# Patient Record
Sex: Female | Born: 1980 | Race: Black or African American | Hispanic: No | Marital: Single | Smoking: Never smoker
Health system: Southern US, Community
[De-identification: ages and names within clinical notes are randomized; demographics above are authoritative.]

## PROBLEM LIST (undated history)

## (undated) DIAGNOSIS — D219 Benign neoplasm of connective and other soft tissue, unspecified: Secondary | ICD-10-CM

## (undated) DIAGNOSIS — I10 Essential (primary) hypertension: Secondary | ICD-10-CM

## (undated) DIAGNOSIS — K219 Gastro-esophageal reflux disease without esophagitis: Secondary | ICD-10-CM

## (undated) DIAGNOSIS — D649 Anemia, unspecified: Secondary | ICD-10-CM

## (undated) DIAGNOSIS — N920 Excessive and frequent menstruation with regular cycle: Secondary | ICD-10-CM

---

## 2013-11-19 ENCOUNTER — Encounter (HOSPITAL_COMMUNITY): Payer: Self-pay | Admitting: Emergency Medicine

## 2013-11-19 ENCOUNTER — Emergency Department (HOSPITAL_COMMUNITY)
Admission: EM | Admit: 2013-11-19 | Discharge: 2013-11-19 | Disposition: A | Discharge status: Home / Self Care | Payer: 59 | Source: Home / Self Care

## 2013-11-19 DIAGNOSIS — M25469 Effusion, unspecified knee: Secondary | ICD-10-CM

## 2013-11-19 DIAGNOSIS — M2550 Pain in unspecified joint: Secondary | ICD-10-CM

## 2013-11-19 MED ORDER — METHYLPREDNISOLONE 4 MG PO KIT: PACK | ORAL | Status: DC

## 2013-11-19 NOTE — ED Provider Notes (Signed)
CSN: [id number]     Arrival date & time 11/19/13  0831 History   First MD Initiated Contact with Patient 11/19/13 0840     Chief Complaint  Patient presents with  . Joint Swelling   (Consider location/radiation/quality/duration/timing/severity/associated sxs/prior Treatment) HPI Comments: 33 year old female complaining of migrating arthralgias. Primarily occurring in the lower extremities but also in the right elbow. arthralgias are followed by minor swelling and puffiness particularly in the ankles and knees. Denies fever. She went to the [facility] emergency Department a little over a week ago and had fluid aspirated from the knee. She called for results recently and was told that they lost the samples. Today she is having minor right ankle discomfort, left ankle discomfort with mild surrounding puffiness and right elbow pain.   History reviewed. No pertinent past medical history. Past Surgical History  Procedure Laterality Date  . Cesarean section     History reviewed. No pertinent family history. History  Substance Use Topics  . Smoking status: Never Smoker   . Smokeless tobacco: Not on file  . Alcohol Use: No   OB History   Grav Para Term Preterm Abortions TAB SAB Ect Mult Living                 Review of Systems  Constitutional: Positive for activity change. Negative for fever and fatigue.  HENT: Negative.   Respiratory: Negative.   Cardiovascular: Negative.   Gastrointestinal: Negative.   Genitourinary: Negative.   Musculoskeletal: Positive for arthralgias. Negative for back pain, myalgias and neck pain.  Skin: Negative.   Neurological: Negative.     Allergies  Review of patient's allergies indicates not on file.  Home Medications   Prior to Admission medications   Medication Sig Start Date End Date Taking? Authorizing Provider  Ibuprofen (MOTRIN IB PO) Take by mouth.   Yes Historical Provider, MD  methylPREDNISolone (MEDROL DOSEPAK) 4 MG tablet As  directed. Take with food. 11/19/13   Janne Napoleon, NP   BP 147/104  Pulse 103  Temp(Src) 97.3 F (36.3 C) (Oral)  Resp 16  SpO2 94%  LMP 10/31/2013 Physical Exam  Nursing note and vitals reviewed. Constitutional: She is oriented to person, place, and time. She appears well-developed and well-nourished. No distress.  HENT:  Mouth/Throat: Oropharynx is clear and moist. No oropharyngeal exudate.  Eyes: Conjunctivae and EOM are normal. Pupils are equal, round, and reactive to light.  Neck: Normal range of motion. Neck supple.  Thyroid appears full, slightly enlarged. No nodules palpated no tenderness.  Cardiovascular: Normal rate, regular rhythm and normal heart sounds.   Pulmonary/Chest: Effort normal and breath sounds normal. No respiratory distress. She has no wheezes. She has no rales.  Musculoskeletal: Normal range of motion. She exhibits edema and tenderness.  No joint erythema. No knee tenderness bilaterally. Mild/moderate tenderness of bilateral ankles and right elbow. Ambulatory with full weightbearing. Full range of motion of all joints.  Lymphadenopathy:    She has no cervical adenopathy.  Neurological: She is alert and oriented to person, place, and time.  Skin: Skin is warm and dry.  Psychiatric: She has a normal mood and affect.    ED Course  Procedures (including critical care time) Labs Review Labs Reviewed - No data to display  Imaging Review No results found.   MDM   1. Polyarthralgia   2. Joint swelling of lower leg     Medrol dose pack See PCP for eval and labs to assess conditions that may include autoimmune,  inflammatory illness Also have thyroid panel, thyroid looks a little full.     Janne Napoleon, NP 11/19/13 [phone]

## 2013-11-19 NOTE — Discharge Instructions (Signed)
Arthralgia °Your caregiver has diagnosed you as suffering from an arthralgia. Arthralgia means there is pain in a joint. This can come from many reasons including: °· Bruising the joint which causes soreness (inflammation) in the joint. °· Wear and tear on the joints which occur as we grow older (osteoarthritis). °· Overusing the joint. °· Various forms of arthritis. °· Infections of the joint. °Regardless of the cause of pain in your joint, most of these different pains respond to anti-inflammatory drugs and rest. The exception to this is when a joint is infected, and these cases are treated with antibiotics, if it is a bacterial infection. °HOME CARE INSTRUCTIONS  °· Rest the injured area for as long as directed by your caregiver. Then slowly start using the joint as directed by your caregiver and as the pain allows. Crutches as directed may be useful if the ankles, knees or hips are involved. If the knee was splinted or casted, continue use and care as directed. If an stretchy or elastic wrapping bandage has been applied today, it should be removed and re-applied every 3 to 4 hours. It should not be applied tightly, but firmly enough to keep swelling down. Watch toes and feet for swelling, bluish discoloration, coldness, numbness or excessive pain. If any of these problems (symptoms) occur, remove the ace bandage and re-apply more loosely. If these symptoms persist, contact your caregiver or return to this location. °· For the first 24 hours, keep the injured extremity elevated on pillows while lying down. °· Apply ice for 15-20 minutes to the sore joint every couple hours while awake for the first half day. Then 03-04 times per day for the first 48 hours. Put the ice in a plastic bag and place a towel between the bag of ice and your skin. °· Wear any splinting, casting, elastic bandage applications, or slings as instructed. °· Only take over-the-counter or prescription medicines for pain, discomfort, or fever as  directed by your caregiver. Do not use aspirin immediately after the injury unless instructed by your physician. Aspirin can cause increased bleeding and bruising of the tissues. °· If you were given crutches, continue to use them as instructed and do not resume weight bearing on the sore joint until instructed. °Persistent pain and inability to use the sore joint as directed for more than 2 to 3 days are warning signs indicating that you should see a caregiver for a follow-up visit as soon as possible. Initially, a hairline fracture (break in bone) may not be evident on X-rays. Persistent pain and swelling indicate that further evaluation, non-weight bearing or use of the joint (use of crutches or slings as instructed), or further X-rays are indicated. X-rays may sometimes not show a small fracture until a week or 10 days later. Make a follow-up appointment with your own caregiver or one to whom we have referred you. A radiologist (specialist in reading X-rays) may read your X-rays. Make sure you know how you are to obtain your X-ray results. Do not assume everything is normal if you do not hear from us. °SEEK MEDICAL CARE IF: °Bruising, swelling, or pain increases. °SEEK IMMEDIATE MEDICAL CARE IF:  °· Your fingers or toes are numb or blue. °· The pain is not responding to medications and continues to stay the same or get worse. °· The pain in your joint becomes severe. °· You develop a fever over 102° F (38.9° C). °· It becomes impossible to move or use the joint. °MAKE SURE YOU:  °·   Understand these instructions. °· Will watch your condition. °· Will get help right away if you are not doing well or get worse. °Document Released: 04/05/2005 Document Revised: 06/28/2011 Document Reviewed: 11/22/2007 °ExitCare® Patient Information ©2015 [employer]. This information is not intended to replace advice given to you by your health care provider. Make sure you discuss any questions you have with your health care  provider. ° °Arthritis, Nonspecific °Arthritis is pain, redness, warmth, or puffiness (inflammation) of a joint. The joint may be stiff or hurt when you move it. One or more joints may be affected. There are many types of arthritis. Your doctor may not know what type you have right away. The most common cause of arthritis is wear and tear on the joint (osteoarthritis). °HOME CARE  °· Only take medicine as told by your doctor. °· Rest the joint as much as possible. °· Raise (elevate) your joint if it is puffy. °· Use crutches if the painful joint is in your leg. °· Drink enough fluids to keep your pee (urine) clear or pale yellow. °· Follow your doctor's diet instructions. °· Use cold packs for very bad joint pain for 10 to 15 minutes every hour. Ask your doctor if it is okay for you to use hot packs. °· Exercise as told by your doctor. °· Take a warm shower if you have stiffness in the morning. °· Move your sore joints throughout the day. °GET HELP RIGHT AWAY IF:  °· You have a fever. °· You have very bad joint pain, puffiness, or redness. °· You have many joints that are painful and puffy. °· You are not getting better with treatment. °· You have very bad back pain or leg weakness. °· You cannot control when you poop (bowel movement) or pee (urinate). °· You do not feel better in 24 hours or are getting worse. °· You are having side effects from your medicine. °MAKE SURE YOU:  °· Understand these instructions. °· Will watch your condition. °· Will get help right away if you are not doing well or get worse. °Document Released: 06/30/2009 Document Revised: 10/05/2011 Document Reviewed: 06/30/2009 °ExitCare® Patient Information ©2015 [employer]. This information is not intended to replace advice given to you by your health care provider. Make sure you discuss any questions you have with your health care provider. ° °

## 2013-11-19 NOTE — ED Notes (Signed)
Pt  Has    swelling of  Some  Of  The  Joints  In her  Body       Especially  The  l  Ankle     As   Well  The  l  Wrist           And  r  Elbow    Some  Pain in the  Joints  As  Well     Pt  Was  Seen in  Er  sev  Weeks  Ago  And  Was  rx     Prednisone         Which  Helped while  Taking  It  But  Ran  Out      -  She  Reports  Had  Blood  Drawn  But  Samples  Was  Lost  She  Reports

## 2013-11-21 NOTE — ED Provider Notes (Signed)
Medical screening examination/treatment/procedure(s) were performed by non-physician practitioner and as supervising physician I was immediately available for consultation/collaboration.  Philipp Deputy, M.D.  Harden Mo, MD 11/21/13 [phone]

## 2015-06-19 ENCOUNTER — Emergency Department (INDEPENDENT_AMBULATORY_CARE_PROVIDER_SITE_OTHER)
Admission: EM | Admit: 2015-06-19 | Discharge: 2015-06-19 | Disposition: A | Discharge status: Home / Self Care | Payer: 59 | Source: Home / Self Care | Attending: Family Medicine | Admitting: Family Medicine

## 2015-06-19 ENCOUNTER — Emergency Department (INDEPENDENT_AMBULATORY_CARE_PROVIDER_SITE_OTHER): Payer: 59

## 2015-06-19 ENCOUNTER — Encounter (HOSPITAL_COMMUNITY): Payer: Self-pay | Admitting: Emergency Medicine

## 2015-06-19 ENCOUNTER — Emergency Department (HOSPITAL_COMMUNITY): Payer: 59

## 2015-06-19 DIAGNOSIS — M25562 Pain in left knee: Secondary | ICD-10-CM

## 2015-06-19 DIAGNOSIS — M222X1 Patellofemoral disorders, right knee: Secondary | ICD-10-CM

## 2015-06-19 DIAGNOSIS — M25561 Pain in right knee: Secondary | ICD-10-CM

## 2015-06-19 DIAGNOSIS — M222X2 Patellofemoral disorders, left knee: Secondary | ICD-10-CM | POA: Diagnosis not present

## 2015-06-19 DIAGNOSIS — M25569 Pain in unspecified knee: Secondary | ICD-10-CM | POA: Diagnosis not present

## 2015-06-19 MED ORDER — NAPROXEN SODIUM 550 MG PO TABS: 550.0000 mg | ORAL_TABLET | Freq: Two times a day (BID) | ORAL | Status: DC

## 2015-06-19 NOTE — Discharge Instructions (Signed)
Heat Therapy Heat therapy can help ease sore, stiff, injured, and tight muscles and joints. Heat relaxes your muscles, which may help ease your pain. Heat therapy should only be used on old, pre-existing, or long-lasting (chronic) injuries. Do not use heat therapy unless told by your doctor. HOW TO USE HEAT THERAPY There are several different kinds of heat therapy, including:  Moist heat pack.  Warm water bath.  Hot water bottle.  Electric heating pad.  Heated gel pack.  Heated wrap.  Electric heating pad. GENERAL HEAT THERAPY RECOMMENDATIONS   Do not sleep while using heat therapy. Only use heat therapy while you are awake.  Your skin may turn pink while using heat therapy. Do not use heat therapy if your skin turns red.  Do not use heat therapy if you have new pain.  High heat or long exposure to heat can cause burns. Be careful when using heat therapy to avoid burning your skin.  Do not use heat therapy on areas of your skin that are already irritated, such as with a rash or sunburn. GET HELP IF:   You have blisters, redness, swelling (puffiness), or numbness.  You have new pain.  Your pain is worse. MAKE SURE YOU:  Understand these instructions.  Will watch your condition.  Will get help right away if you are not doing well or get worse.   This information is not intended to replace advice given to you by your health care provider. Make sure you discuss any questions you have with your health care provider.   Document Released: 06/28/2011 Document Revised: 04/26/2014 Document Reviewed: 05/29/2013 Elsevier Interactive Patient Education 2016 Elsevier Inc. Patellofemoral Pain Syndrome Patellofemoral pain syndrome is a condition that involves a softening or breakdown of the tissue (cartilage) on the underside of your kneecap (patella). This causes pain in the front of the knee. The condition is also called runner's knee or chondromalacia patella. Patellofemoral pain  syndrome is most common in young adults who are active in sports. Your knee is the largest joint in your body. The patella covers the front of your knee and is attached to muscles above and below your knee. The underside of the patella is covered with a smooth type of cartilage (synovium). The smooth surface helps the patella glide easily when you move your knee. Patellofemoral pain syndrome causes swelling in the joint linings and bone surfaces in your knee.  CAUSES  Patellofemoral pain syndrome can be caused by:  Overuse.  Poor alignment of your knee joints.  Weak leg muscles.  A direct blow to your kneecap. RISK FACTORS You may be at risk for patellofemoral pain syndrome if you:  Do a lot of activities that can wear down your kneecap. These include:  Running.  Squatting.  Climbing stairs.  Start a new physical activity or exercise program.  Wear shoes that do not fit well.  Do not have good leg strength.  Are overweight. SIGNS AND SYMPTOMS  Knee pain is the most common symptom of patellofemoral pain syndrome. This may feel like a dull, aching pain underneath your patella, in the front of your knee. There may be a popping or cracking sound when you move your knee. Pain may get worse with:  Exercise.  Climbing stairs.  Running.  Jumping.  Squatting.  Kneeling.  Sitting for a long time.  Moving or pushing on your patella. DIAGNOSIS  Your health care provider may be able to diagnose patellofemoral pain syndrome from your symptoms and medical history.  You may be asked about your recent physical activities and which ones cause knee pain. Your health care provider may do a physical exam with certain tests to confirm the diagnosis. These may include:  Moving your patella back and forth.  Checking your range of knee motion.  Having you squat or jump to see if you have pain.  Checking the strength of your leg muscles. An MRI of the knee may also be done. TREATMENT    Patellofemoral pain syndrome can usually be treated at home with rest, ice, compression, and elevation (RICE). Other treatments may include:  Nonsteroidal anti-inflammatory drugs (NSAIDs).  Physical therapy to stretch and strengthen your leg muscles.  Shoe inserts (orthotics) to take stress off your knee.  A knee brace or knee support.  Surgery to remove damaged cartilage or move the patella to a better position. The need for surgery is rare. HOME CARE INSTRUCTIONS   Take medicines only as directed by your health care provider.  Rest your knee.  When resting, keep your knee raised above the level of your heart.  Avoid activities that cause knee pain.  Apply ice to the injured area:  Put ice in a plastic bag.  Place a towel between your skin and the bag.  Leave the ice on for 20 minutes, 2-3 times a day.  Use splints, braces, knee supports, or walking aids as directed by your health care provider.  Perform stretching and strengthening exercises as directed by your health care provider or physical therapist.  Keep all follow-up visits as directed by your health care provider. This is important. SEEK MEDICAL CARE IF:   Your symptoms get worse.  You are not improving with home care. MAKE SURE YOU:  Understand these instructions.  Will watch your condition.  Will get help right away if you are not doing well or get worse.   This information is not intended to replace advice given to you by your health care provider. Make sure you discuss any questions you have with your health care provider.   Document Released: 03/24/2009 Document Revised: 04/26/2014 Document Reviewed: 06/25/2013 Elsevier Interactive Patient Education [employer].

## 2015-06-19 NOTE — ED Notes (Signed)
Knee pain, recently started walking/jogging.  Bilateral knee pain that has worsened since last week.

## 2015-06-19 NOTE — ED Provider Notes (Signed)
CSN: [id number]     Arrival date & time 06/19/15  1306 History   First MD Initiated Contact with Patient 06/19/15 1328     Chief Complaint  Patient presents with  . 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  QUALITY: similar to previous episode of knee pain about 1 year ago. Treated with prednisone and did get better without problem until the last week. MODIFYING FACTORS: RICE treatment, ibuprofen ASSOCIATED SYMPTOMS: sensation that there is decreased ROM TIMING:now contant OCCUPATION: nurse tech ED  No past medical history on file. Past Surgical History  Procedure Laterality Date  . Cesarean section     No family history on file. Social History  Substance Use Topics  . Smoking status: Never Smoker   . Smokeless tobacco: Not on file  . Alcohol Use: No   OB History    No data available     Review of Systems B/l knee pain left worse than right Allergies  Review of patient's allergies indicates not on file.  Home Medications   Prior to Admission medications   Medication Sig Start Date End Date Taking? Authorizing Provider  Ibuprofen (MOTRIN IB PO) Take by mouth.    Historical Provider, MD  methylPREDNISolone (MEDROL DOSEPAK) 4 MG tablet As directed. Take with food. 11/19/13   Janne Napoleon, NP   Meds Ordered and Administered this Visit  Medications - No data to display  BP 147/97 mmHg  Pulse 74  Temp(Src) 98.2 F (36.8 C) (Oral)  Resp 18  SpO2 100% No data found.   Physical Exam  Constitutional: She appears well-developed and well-nourished.  Musculoskeletal: Normal range of motion. She exhibits no tenderness.       Right knee: She exhibits normal range of motion, no swelling,  no deformity, no laceration and normal alignment.       Legs: Nursing note and vitals reviewed.   ED Course  Procedures (including critical care time)  Labs Review Labs Reviewed - No data to display  Imaging Review Dg Knee Complete 4 Views Left  06/19/2015  CLINICAL DATA:  Pain after jogging.  No focal trauma history. EXAM: LEFT KNEE - COMPLETE 4+ VIEW COMPARISON:  None. FINDINGS: Frontal, lateral, and bilateral oblique views were obtained. There is no fracture or dislocation. No joint effusion. The joint spaces appear normal. No erosive change. IMPRESSION: No fracture or effusion.  No appreciable arthropathic change. Electronically Signed   By: Lowella Grip III M.D.   On: 06/19/2015 14:24   Dg Knee Complete 4 Views Right  06/19/2015  CLINICAL DATA:  Pain after jogging ; no trauma history EXAM: RIGHT KNEE - COMPLETE 4+ VIEW COMPARISON:  None. FINDINGS: Frontal, lateral, and bilateral oblique views were obtained. There is no demonstrable acute fracture or dislocation. No joint effusion. The joint spaces appear normal. No erosive change. There is an apparent unfused apophysis along the lateral proximal most aspect of the fibula. 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 No demonstrable acute fracture or dislocation. No joint effusion. No appreciable arthropathic change. Electronically Signed   By: Lowella Grip III M.D.   On: 06/19/2015 14:31     Visual Acuity Review  Right Eye Distance:   Left Eye Distance:   Bilateral Distance:  Right Eye Near:   Left Eye Near:    Bilateral Near:        Review of knee XR with patient no major bony pathology for cause of discomfort.  MDM   1. Knee pain, bilateral   2. Patellofemoral arthralgia of both knees    Patient is reassured that there is no indication for more advance testing at this time.  Patient is advised to continue home symptomatic treatment.  Patient is advised that if there are new or  worsening symptoms or attend the emergency department, or contact primary care provider. Instructions of care provided discharged home in stable condition. Return to work/school note provided.  THIS NOTE WAS GENERATED USING A VOICE RECOGNITION SOFTWARE PROGRAM. ALL REASONABLE EFFORTS  WERE MADE TO PROOFREAD THIS DOCUMENT FOR ACCURACY.     Konrad Felix, [place] 06/19/15 [phone]

## 2015-07-01 DIAGNOSIS — M7052 Other bursitis of knee, left knee: Secondary | ICD-10-CM | POA: Diagnosis not present

## 2015-07-01 DIAGNOSIS — M7051 Other bursitis of knee, right knee: Secondary | ICD-10-CM | POA: Diagnosis not present

## 2015-07-16 DIAGNOSIS — M7051 Other bursitis of knee, right knee: Secondary | ICD-10-CM | POA: Diagnosis not present

## 2015-07-16 DIAGNOSIS — M7052 Other bursitis of knee, left knee: Secondary | ICD-10-CM | POA: Diagnosis not present

## 2015-07-16 DIAGNOSIS — M79605 Pain in left leg: Secondary | ICD-10-CM | POA: Diagnosis not present

## 2015-07-16 DIAGNOSIS — M79662 Pain in left lower leg: Secondary | ICD-10-CM | POA: Diagnosis not present

## 2015-07-16 DIAGNOSIS — R600 Localized edema: Secondary | ICD-10-CM | POA: Diagnosis not present

## 2015-12-16 DIAGNOSIS — D485 Neoplasm of uncertain behavior of skin: Secondary | ICD-10-CM | POA: Diagnosis not present

## 2017-03-16 DIAGNOSIS — Z1331 Encounter for screening for depression: Secondary | ICD-10-CM | POA: Diagnosis not present

## 2017-03-16 DIAGNOSIS — I10 Essential (primary) hypertension: Secondary | ICD-10-CM | POA: Diagnosis not present

## 2017-03-16 DIAGNOSIS — Z6828 Body mass index (BMI) 28.0-28.9, adult: Secondary | ICD-10-CM | POA: Diagnosis not present

## 2017-04-13 MED FILL — LISINOPRIL 10 MG TABS: 10 | 30 days supply | Qty: 30 | Fill #0

## 2017-04-14 DIAGNOSIS — I10 Essential (primary) hypertension: Secondary | ICD-10-CM | POA: Diagnosis not present

## 2017-04-14 DIAGNOSIS — R42 Dizziness and giddiness: Secondary | ICD-10-CM | POA: Diagnosis not present

## 2017-04-14 MED FILL — LISINOPRIL-HCTZ 10-12.5 MG: 10-12.5 | 30 days supply | Qty: 30 | Fill #0

## 2017-05-25 DIAGNOSIS — Z6828 Body mass index (BMI) 28.0-28.9, adult: Secondary | ICD-10-CM | POA: Diagnosis not present

## 2017-05-25 DIAGNOSIS — I10 Essential (primary) hypertension: Secondary | ICD-10-CM | POA: Diagnosis not present

## 2017-05-25 MED FILL — LISINOPRIL-HCTZ 10-12.5 MG: 10-12.5 | 30 days supply | Qty: 30 | Fill #0

## 2017-07-15 MED FILL — LISINOPRIL-HCTZ 10-12.5 MG: 10-12.5 | 30 days supply | Qty: 30 | Fill #1

## 2017-08-24 DIAGNOSIS — I10 Essential (primary) hypertension: Secondary | ICD-10-CM | POA: Diagnosis not present

## 2017-08-24 DIAGNOSIS — Z6827 Body mass index (BMI) 27.0-27.9, adult: Secondary | ICD-10-CM | POA: Diagnosis not present

## 2017-08-24 DIAGNOSIS — Z136 Encounter for screening for cardiovascular disorders: Secondary | ICD-10-CM | POA: Diagnosis not present

## 2017-08-24 DIAGNOSIS — Z131 Encounter for screening for diabetes mellitus: Secondary | ICD-10-CM | POA: Diagnosis not present

## 2017-08-24 MED FILL — LISINOPRIL-HCTZ 10-12.5 MG: 10-12.5 | 30 days supply | Qty: 30 | Fill #0

## 2017-10-25 MED FILL — LISINOPRIL-HCTZ 10-12.5 MG: 10-12.5 | 30 days supply | Qty: 30 | Fill #1

## 2017-12-16 IMAGING — DX DG KNEE COMPLETE 4+V*R*
4 series · 4 of 4 positions shown · non-contrast
Comparison: None.

CLINICAL DATA: Pain after jogging ; no trauma history

EXAM:
RIGHT KNEE - COMPLETE 4+ VIEW

[knee ap]
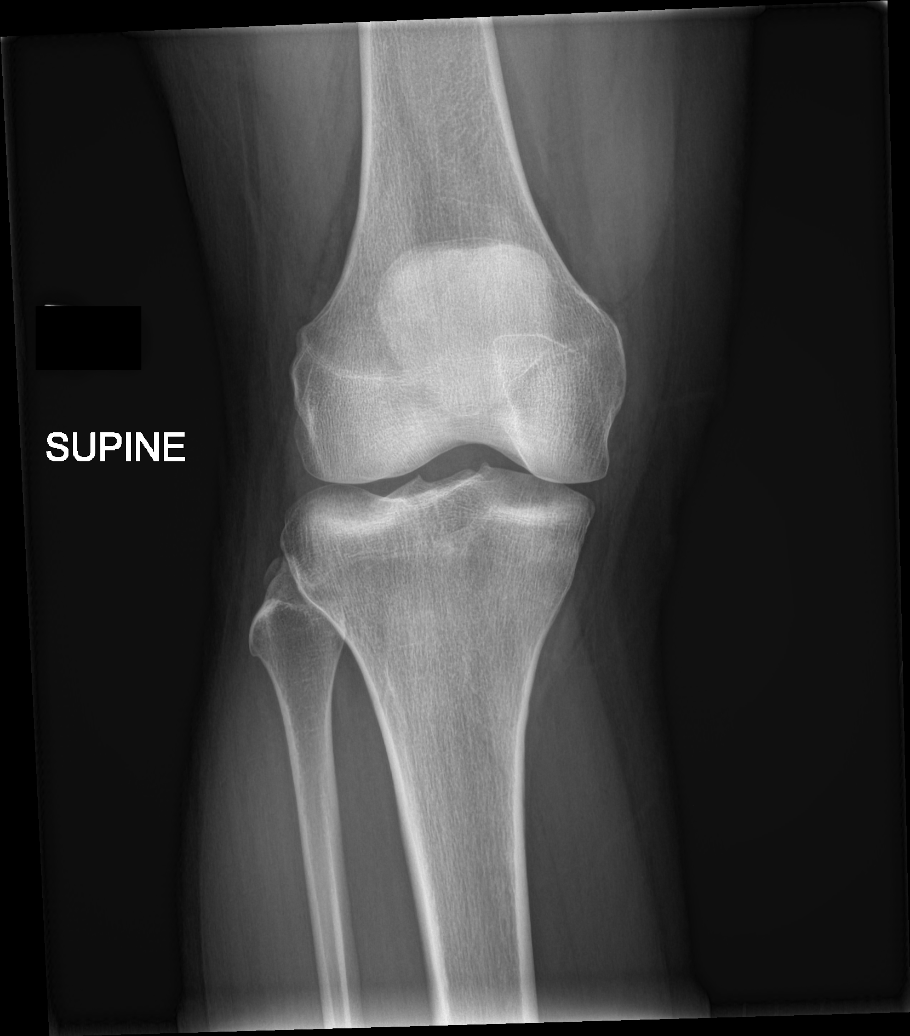

[knee obl (1 of 2)]
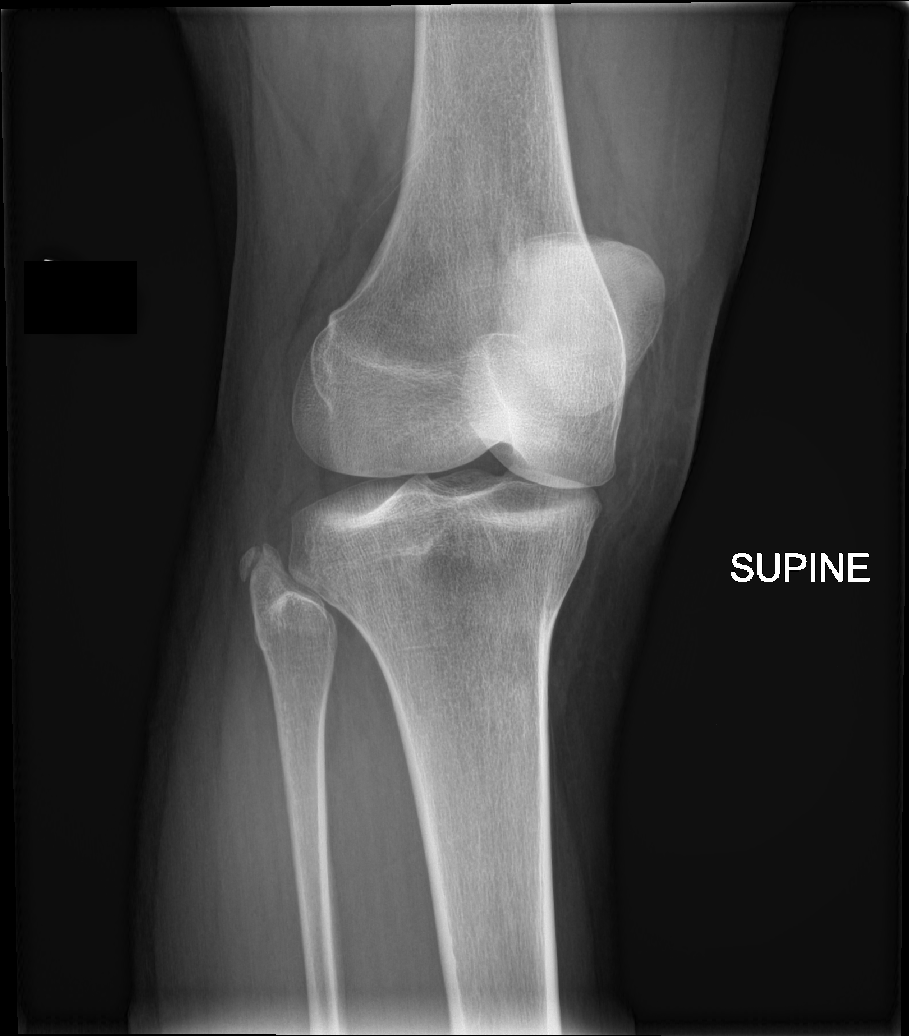

[knee obl (2 of 2)]
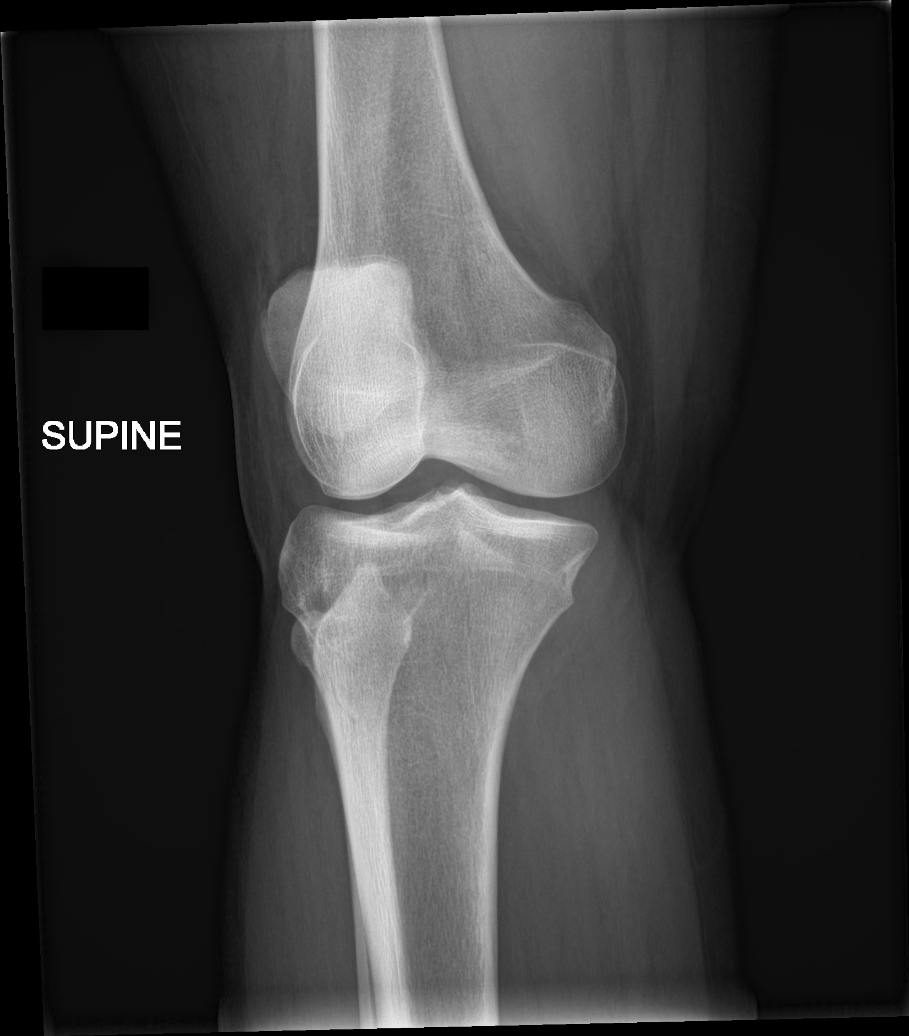

[knee lat]
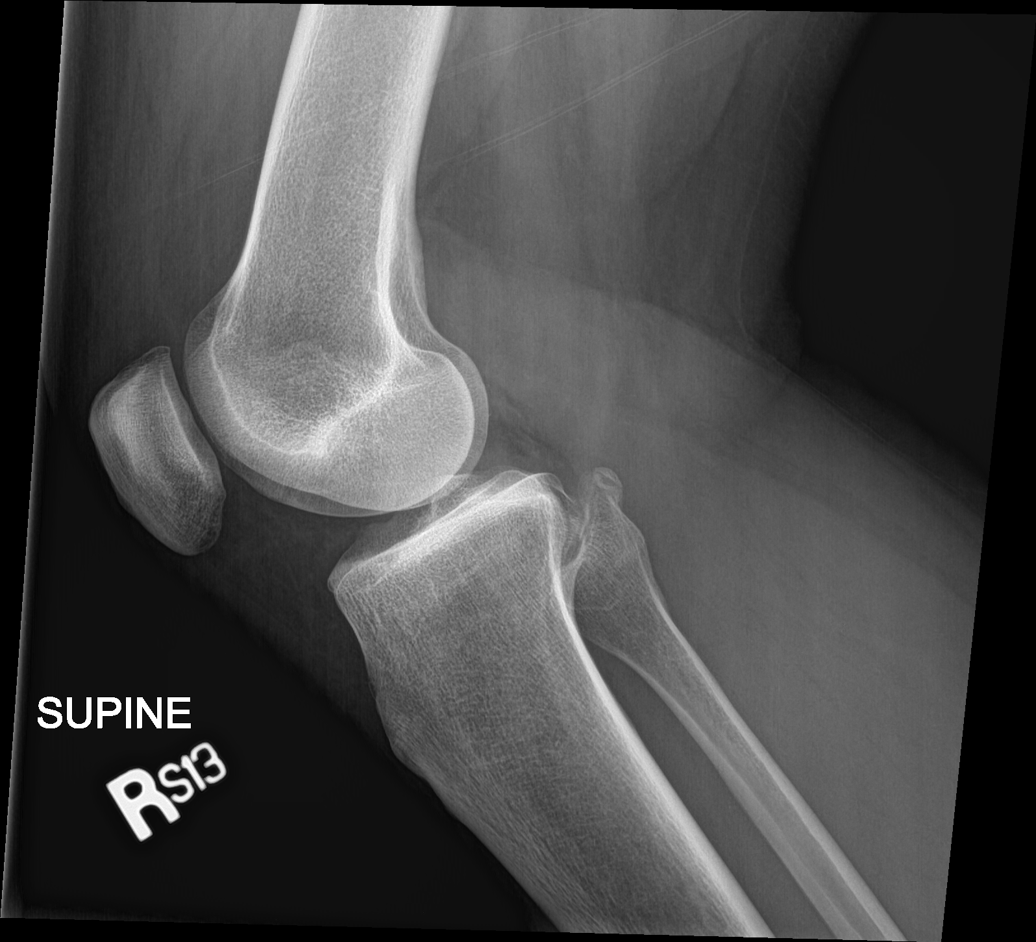

[4 of 4 positions shown; findings below may reference images not displayed]

FINDINGS: Frontal, lateral, and bilateral oblique views were obtained. There
is no demonstrable acute fracture or dislocation. No joint effusion.
The joint spaces appear normal. No erosive change. There is an
apparent unfused apophysis along the lateral proximal most aspect of
the fibula.
IMPRESSION: Probable unfused apophysis along the anterior lateral fibula. No
demonstrable acute fracture or dislocation. No joint effusion. No
appreciable arthropathic change.

## 2017-12-20 MED FILL — LISINOPRIL-HCTZ 10-12.5 MG: 10-12.5 | 30 days supply | Qty: 30 | Fill #2

## 2018-02-02 MED FILL — LISINOPRIL-HCTZ 10-12.5 MG: 10-12.5 | 30 days supply | Qty: 30 | Fill #3

## 2018-03-09 DIAGNOSIS — I10 Essential (primary) hypertension: Secondary | ICD-10-CM | POA: Diagnosis not present

## 2018-03-22 MED FILL — LISINOPRIL-HCTZ 10-12.5 MG: 10-12.5 | 30 days supply | Qty: 30 | Fill #4

## 2018-05-10 DIAGNOSIS — D485 Neoplasm of uncertain behavior of skin: Secondary | ICD-10-CM | POA: Diagnosis not present

## 2018-05-10 DIAGNOSIS — L578 Other skin changes due to chronic exposure to nonionizing radiation: Secondary | ICD-10-CM | POA: Diagnosis not present

## 2018-05-10 DIAGNOSIS — L821 Other seborrheic keratosis: Secondary | ICD-10-CM | POA: Diagnosis not present

## 2018-05-26 MED FILL — LISINOPRIL-HCTZ 10-12.5 MG: 10-12.5 | 30 days supply | Qty: 30 | Fill #5

## 2018-07-11 MED FILL — LISINOPRIL-HCTZ 10-12.5 MG: 10-12.5 | 90 days supply | Qty: 90 | Fill #0

## 2018-09-07 DIAGNOSIS — I10 Essential (primary) hypertension: Secondary | ICD-10-CM | POA: Diagnosis not present

## 2019-01-25 DIAGNOSIS — I10 Essential (primary) hypertension: Secondary | ICD-10-CM | POA: Diagnosis not present

## 2019-01-25 DIAGNOSIS — Z79899 Other long term (current) drug therapy: Secondary | ICD-10-CM | POA: Diagnosis not present

## 2019-01-25 DIAGNOSIS — Z136 Encounter for screening for cardiovascular disorders: Secondary | ICD-10-CM | POA: Diagnosis not present

## 2019-01-25 DIAGNOSIS — Z1331 Encounter for screening for depression: Secondary | ICD-10-CM | POA: Diagnosis not present

## 2019-01-25 DIAGNOSIS — Z20828 Contact with and (suspected) exposure to other viral communicable diseases: Secondary | ICD-10-CM | POA: Diagnosis not present

## 2019-01-25 MED FILL — LISINOPRIL-HCTZ 10-12.5 MG: 10-12.5 | 90 days supply | Qty: 90 | Fill #0

## 2019-02-13 DIAGNOSIS — D509 Iron deficiency anemia, unspecified: Secondary | ICD-10-CM | POA: Diagnosis not present

## 2019-02-26 DIAGNOSIS — D509 Iron deficiency anemia, unspecified: Secondary | ICD-10-CM | POA: Diagnosis not present

## 2019-03-07 DIAGNOSIS — D509 Iron deficiency anemia, unspecified: Secondary | ICD-10-CM | POA: Diagnosis not present

## 2019-03-12 DIAGNOSIS — D509 Iron deficiency anemia, unspecified: Secondary | ICD-10-CM | POA: Diagnosis not present

## 2019-03-13 DIAGNOSIS — D509 Iron deficiency anemia, unspecified: Secondary | ICD-10-CM | POA: Diagnosis not present

## 2019-03-19 DIAGNOSIS — D509 Iron deficiency anemia, unspecified: Secondary | ICD-10-CM | POA: Diagnosis not present

## 2019-03-20 DIAGNOSIS — D509 Iron deficiency anemia, unspecified: Secondary | ICD-10-CM | POA: Diagnosis not present

## 2019-04-10 DIAGNOSIS — D500 Iron deficiency anemia secondary to blood loss (chronic): Secondary | ICD-10-CM | POA: Diagnosis not present

## 2019-04-10 MED FILL — SUPREP BOWEL PREP KIT: 17.5-3.13-1 | 1 days supply | Qty: 354 | Fill #0

## 2019-04-30 DIAGNOSIS — N921 Excessive and frequent menstruation with irregular cycle: Secondary | ICD-10-CM | POA: Diagnosis not present

## 2019-04-30 DIAGNOSIS — R42 Dizziness and giddiness: Secondary | ICD-10-CM | POA: Diagnosis not present

## 2019-04-30 DIAGNOSIS — D509 Iron deficiency anemia, unspecified: Secondary | ICD-10-CM | POA: Diagnosis not present

## 2019-05-01 MED FILL — NORG-EE 0.18-0.215-0.25/0.0: 0.18/0.215/ | 28 days supply | Qty: 28 | Fill #0

## 2019-05-02 MED FILL — SCOPOLAMINE 1 MG/3DAYS PT72: 1 | 18 days supply | Qty: 6 | Fill #0

## 2019-05-08 DIAGNOSIS — D500 Iron deficiency anemia secondary to blood loss (chronic): Secondary | ICD-10-CM | POA: Diagnosis not present

## 2019-05-30 MED FILL — LISINOPRIL-HCTZ 10-12.5 MG: 10-12.5 | 90 days supply | Qty: 90 | Fill #1

## 2019-05-30 MED FILL — NORG-EE 0.18-0.215-0.25/0.0: 0.18/0.215/ | 28 days supply | Qty: 28 | Fill #1

## 2019-05-31 DIAGNOSIS — N920 Excessive and frequent menstruation with regular cycle: Secondary | ICD-10-CM | POA: Diagnosis not present

## 2019-05-31 DIAGNOSIS — D500 Iron deficiency anemia secondary to blood loss (chronic): Secondary | ICD-10-CM | POA: Diagnosis not present

## 2019-05-31 DIAGNOSIS — D509 Iron deficiency anemia, unspecified: Secondary | ICD-10-CM | POA: Diagnosis not present

## 2019-07-19 MED FILL — NORG-EE 0.18-0.215-0.25/0.0: 0.18/0.215/ | 28 days supply | Qty: 28 | Fill #2

## 2019-08-07 DIAGNOSIS — R109 Unspecified abdominal pain: Secondary | ICD-10-CM | POA: Diagnosis not present

## 2019-08-07 DIAGNOSIS — I10 Essential (primary) hypertension: Secondary | ICD-10-CM | POA: Diagnosis not present

## 2019-08-07 DIAGNOSIS — D509 Iron deficiency anemia, unspecified: Secondary | ICD-10-CM | POA: Diagnosis not present

## 2019-08-07 DIAGNOSIS — E785 Hyperlipidemia, unspecified: Secondary | ICD-10-CM | POA: Diagnosis not present

## 2021-01-29 ENCOUNTER — Inpatient Hospital Stay: Payer: Self-pay | Attending: Hematology and Oncology | Admitting: Hematology and Oncology

## 2021-01-29 VITALS — BP 158/102 | HR 86 | Temp 99.1°F | Resp 20 | Ht 66.0 in | Wt 184.2 lb

## 2021-01-29 DIAGNOSIS — N6310 Unspecified lump in the right breast, unspecified quadrant: Secondary | ICD-10-CM

## 2021-01-29 NOTE — Progress Notes (Signed)
Ms. Kimberly Kramer is a 40 y.o. female who presents to [facility] clinic today with complaint of possible right breast mass noted on mammogram.    Pap Smear: Pap not smear completed today. Last Pap smear was 01/12/2021 at [facility] and was normal. Per patient has no history of an abnormal Pap smear. Last Pap smear result is not available in Epic.   Physical exam: Breasts Breasts symmetrical. No skin abnormalities bilateral breasts. No nipple retraction bilateral breasts. No nipple discharge bilateral breasts. No lymphadenopathy. No lumps palpated bilateral breasts.       Pelvic/Bimanual Pap is not indicated today    Smoking History: Patient is a current smoker and is not interested in the referral line at this time.    Patient Navigation: Patient education provided. Access to services provided for patient through [facility] program. No interpreter provided. No transportation provided   Colorectal Cancer Screening: Per patient has never had colonoscopy completed No complaints today.    Breast and Cervical Cancer Risk Assessment: Patient does not have family history of breast cancer, known genetic mutations, or radiation treatment to the chest before age 61. Patient does not have history of cervical dysplasia, immunocompromised, or DES exposure in-utero.  Risk Assessment   No risk assessment data     A: [facility] exam without pap smear Breast exam benign  P: Referred patient to the [facility] for a diagnostic mammogram. Appointment scheduled .  Melodye Ped, NP 01/29/2021 2:34 PM

## 2021-03-18 ENCOUNTER — Encounter: Payer: Self-pay | Admitting: Hematology and Oncology

## 2021-03-23 ENCOUNTER — Encounter: Payer: Self-pay | Admitting: Hematology and Oncology

## 2022-08-04 ENCOUNTER — Other Ambulatory Visit: Payer: Self-pay | Admitting: Oncology

## 2022-08-04 DIAGNOSIS — D508 Other iron deficiency anemias: Secondary | ICD-10-CM

## 2022-08-05 ENCOUNTER — Inpatient Hospital Stay: Payer: 59 | Admitting: Oncology

## 2022-08-05 ENCOUNTER — Inpatient Hospital Stay: Payer: 59

## 2022-08-06 ENCOUNTER — Inpatient Hospital Stay: Payer: 59 | Attending: Oncology

## 2022-08-06 ENCOUNTER — Encounter: Payer: Self-pay | Admitting: Oncology

## 2022-08-06 ENCOUNTER — Telehealth: Payer: Self-pay | Admitting: Oncology

## 2022-08-06 ENCOUNTER — Other Ambulatory Visit: Payer: Self-pay | Admitting: Oncology

## 2022-08-06 ENCOUNTER — Inpatient Hospital Stay (INDEPENDENT_AMBULATORY_CARE_PROVIDER_SITE_OTHER): Payer: 59 | Admitting: Oncology

## 2022-08-06 VITALS — BP 147/95 | HR 78 | Temp 99.6°F | Resp 16 | Ht 65.5 in | Wt 160.2 lb

## 2022-08-06 DIAGNOSIS — D500 Iron deficiency anemia secondary to blood loss (chronic): Secondary | ICD-10-CM | POA: Diagnosis not present

## 2022-08-06 DIAGNOSIS — R0602 Shortness of breath: Secondary | ICD-10-CM | POA: Insufficient documentation

## 2022-08-06 DIAGNOSIS — D508 Other iron deficiency anemias: Secondary | ICD-10-CM

## 2022-08-06 DIAGNOSIS — Z79899 Other long term (current) drug therapy: Secondary | ICD-10-CM | POA: Diagnosis not present

## 2022-08-06 DIAGNOSIS — N920 Excessive and frequent menstruation with regular cycle: Secondary | ICD-10-CM | POA: Diagnosis not present

## 2022-08-06 DIAGNOSIS — R42 Dizziness and giddiness: Secondary | ICD-10-CM | POA: Diagnosis not present

## 2022-08-06 DIAGNOSIS — D509 Iron deficiency anemia, unspecified: Secondary | ICD-10-CM | POA: Insufficient documentation

## 2022-08-06 LAB — IRON AND TIBC
Iron: 14 ug/dL — ABNORMAL LOW (ref 28–170)
Saturation Ratios: 3 % — ABNORMAL LOW (ref 10.4–31.8)
TIBC: 566 ug/dL — ABNORMAL HIGH (ref 250–450)
UIBC: 552 ug/dL

## 2022-08-06 LAB — VITAMIN B12: Vitamin B-12: 550 pg/mL (ref 180–914)

## 2022-08-06 LAB — CBC AND DIFFERENTIAL
HCT: 24 — AB (ref 36–46)
Hemoglobin: 7.6 — AB (ref 12.0–16.0)
Neutrophils Absolute: 3.34
Platelets: 293 10*3/uL (ref 150–400)
WBC: 5.3

## 2022-08-06 LAB — FERRITIN: Ferritin: 2 ng/mL — ABNORMAL LOW (ref 11–307)

## 2022-08-06 LAB — CBC: RBC: 3.92 (ref 3.87–5.11)

## 2022-08-06 LAB — FOLATE: Folate: 9.7 ng/mL (ref >5.9)

## 2022-08-06 MED FILL — Iron Sucrose Inj 20 MG/ML (Fe Equiv): INTRAVENOUS | Qty: 10 | Status: AC

## 2022-08-06 NOTE — Progress Notes (Signed)
[facility]  [address] [place],  [place]  [address] [phone]  Clinic Day:  08/06/2022  Referring physician: Simone Curia, MD   HISTORY OF PRESENT ILLNESS:  The patient is a 42 y.o. female  who I was asked to re-evaluate for iron deficiency anemia.  She was last seen by me in February 2021 for the same problem that was secondary to heavy menstrual cycles.  The patient claims her menstrual cycles have gotten back to being heavy; they last 7 days at a time, with thick clots being seen on many of those days.  She also has had increased dizziness and shortness of breath.  Her ice cravings have also become more prominent again.     PHYSICAL EXAM:  Blood pressure (!) 147/95, pulse 78, temperature 99.6 F (37.6 C), resp. rate 16, height 5' 5.5" (1.664 m), weight 160 lb 3.2 oz (72.7 kg), SpO2 100 %. Wt Readings from Last 3 Encounters:  08/06/22 160 lb 3.2 oz (72.7 kg)  01/29/21 184 lb 3.2 oz (83.6 kg)   Body mass index is 26.25 kg/m. Performance status (ECOG): 0 - Asymptomatic Physical Exam Constitutional:      Appearance: Normal appearance. She is not ill-appearing.  HENT:     Mouth/Throat:     Mouth: Mucous membranes are moist.     Pharynx: Oropharynx is clear. No oropharyngeal exudate or posterior oropharyngeal erythema.  Cardiovascular:     Rate and Rhythm: Normal rate and regular rhythm.     Heart sounds: No murmur heard.    No friction rub. No gallop.  Pulmonary:     Effort: Pulmonary effort is normal. No respiratory distress.     Breath sounds: Normal breath sounds. No wheezing, rhonchi or rales.  Abdominal:     General: Bowel sounds are normal. There is no distension.     Palpations: Abdomen is soft. There is no mass.     Tenderness: There is no abdominal tenderness.  Musculoskeletal:        General: No swelling.     Right lower leg: No edema.     Left lower leg: No edema.  Lymphadenopathy:     Cervical: No cervical adenopathy.      Upper Body:     Right upper body: No supraclavicular or axillary adenopathy.     Left upper body: No supraclavicular or axillary adenopathy.     Lower Body: No right inguinal adenopathy. No left inguinal adenopathy.  Skin:    General: Skin is warm.     Coloration: Skin is not jaundiced.     Findings: No lesion or rash.  Neurological:     General: No focal deficit present.     Mental Status: She is alert and oriented to person, place, and time. Mental status is at baseline.  Psychiatric:        Mood and Affect: Mood normal.        Behavior: Behavior normal.        Thought Content: Thought content normal.     LABS:    Iron B!2 folat levels pending   ASSESSMENT & PLAN:  A 42 y.o. female who I was asked to re-evaluate for iron deficiency anemia.  I will arrange for her to receive IV iron next week to rapidly replenish her iron stores and normalize her hemoglobin.  She is scheduled to see her gynecologist next month, for which she knows to discuss better ways to control the heaviness of her menstrual cycles.  Otherwise, I will see her back in 3 months to reassess her iron and hemoglobin levels to see how well he/she responded to his/her upcoming IV iron.  The patient understands all the plans discussed today and is in agreement with them.  Adyen Bifulco Kirby Funk, MD

## 2022-08-06 NOTE — Telephone Encounter (Signed)
Patient has been scheduled for follow-up visit per 08/06/22 LOS.  Pt given an appt calendar with date and time.

## 2022-08-09 ENCOUNTER — Encounter: Payer: Self-pay | Admitting: Oncology

## 2022-08-09 ENCOUNTER — Inpatient Hospital Stay: Payer: 59

## 2022-08-09 VITALS — BP 131/71 | HR 88 | Temp 98.0°F | Resp 18 | Ht 65.5 in | Wt 160.0 lb

## 2022-08-09 DIAGNOSIS — D500 Iron deficiency anemia secondary to blood loss (chronic): Secondary | ICD-10-CM | POA: Diagnosis not present

## 2022-08-09 DIAGNOSIS — D508 Other iron deficiency anemias: Secondary | ICD-10-CM

## 2022-08-09 MED ORDER — SODIUM CHLORIDE 0.9 % IV SOLN: Freq: Once | INTRAVENOUS | Status: AC

## 2022-08-09 MED ORDER — SODIUM CHLORIDE 0.9 % IV SOLN
200.0000 mg | Freq: Once | INTRAVENOUS | Status: AC
  Administered 2022-08-09: 200 mg via INTRAVENOUS
  Filled 2022-08-09: qty 200

## 2022-08-09 MED ORDER — ACETAMINOPHEN 325 MG PO TABS
650.0000 mg | ORAL_TABLET | Freq: Once | ORAL | Status: AC
  Administered 2022-08-09: 650 mg via ORAL
  Filled 2022-08-09: qty 2

## 2022-08-09 MED ORDER — LORATADINE 10 MG PO TABS
10.0000 mg | ORAL_TABLET | Freq: Once | ORAL | Status: AC
  Administered 2022-08-09: 10 mg via ORAL
  Filled 2022-08-09: qty 1

## 2022-08-09 MED FILL — Iron Sucrose Inj 20 MG/ML (Fe Equiv): INTRAVENOUS | Qty: 10 | Status: AC

## 2022-08-10 ENCOUNTER — Inpatient Hospital Stay: Payer: 59

## 2022-08-10 VITALS — BP 130/77 | HR 77 | Temp 98.0°F | Resp 18 | Wt 160.0 lb

## 2022-08-10 DIAGNOSIS — D508 Other iron deficiency anemias: Secondary | ICD-10-CM

## 2022-08-10 DIAGNOSIS — D500 Iron deficiency anemia secondary to blood loss (chronic): Secondary | ICD-10-CM | POA: Diagnosis not present

## 2022-08-10 MED ORDER — SODIUM CHLORIDE 0.9 % IV SOLN
200.0000 mg | Freq: Once | INTRAVENOUS | Status: AC
  Administered 2022-08-10: 200 mg via INTRAVENOUS
  Filled 2022-08-10: qty 200

## 2022-08-10 MED ORDER — SODIUM CHLORIDE 0.9 % IV SOLN: Freq: Once | INTRAVENOUS | Status: AC

## 2022-08-10 MED ORDER — ACETAMINOPHEN 325 MG PO TABS: 650.0000 mg | ORAL_TABLET | Freq: Once | ORAL | Status: DC

## 2022-08-10 MED ORDER — LORATADINE 10 MG PO TABS: 10.0000 mg | ORAL_TABLET | Freq: Once | ORAL | Status: DC

## 2022-08-10 MED FILL — Iron Sucrose Inj 20 MG/ML (Fe Equiv): INTRAVENOUS | Qty: 10 | Status: AC

## 2022-08-10 NOTE — Patient Instructions (Signed)

## 2022-08-11 ENCOUNTER — Inpatient Hospital Stay: Payer: 59

## 2022-08-11 VITALS — BP 129/91 | HR 87 | Temp 97.7°F | Resp 18

## 2022-08-11 DIAGNOSIS — D500 Iron deficiency anemia secondary to blood loss (chronic): Secondary | ICD-10-CM | POA: Diagnosis not present

## 2022-08-11 DIAGNOSIS — D508 Other iron deficiency anemias: Secondary | ICD-10-CM

## 2022-08-11 MED ORDER — SODIUM CHLORIDE 0.9 % IV SOLN: Freq: Once | INTRAVENOUS | Status: AC

## 2022-08-11 MED ORDER — LORATADINE 10 MG PO TABS: 10.0000 mg | ORAL_TABLET | Freq: Once | ORAL | Status: DC

## 2022-08-11 MED ORDER — ACETAMINOPHEN 325 MG PO TABS: 650.0000 mg | ORAL_TABLET | Freq: Once | ORAL | Status: DC

## 2022-08-11 MED ORDER — SODIUM CHLORIDE 0.9 % IV SOLN
200.0000 mg | Freq: Once | INTRAVENOUS | Status: AC
  Administered 2022-08-11: 200 mg via INTRAVENOUS
  Filled 2022-08-11: qty 200

## 2022-08-11 MED FILL — Iron Sucrose Inj 20 MG/ML (Fe Equiv): INTRAVENOUS | Qty: 10 | Status: AC

## 2022-08-11 NOTE — Patient Instructions (Signed)

## 2022-08-12 ENCOUNTER — Inpatient Hospital Stay: Payer: 59

## 2022-08-12 VITALS — BP 127/94 | HR 84 | Temp 98.0°F | Resp 16 | Wt 157.2 lb

## 2022-08-12 DIAGNOSIS — D500 Iron deficiency anemia secondary to blood loss (chronic): Secondary | ICD-10-CM | POA: Diagnosis not present

## 2022-08-12 DIAGNOSIS — D508 Other iron deficiency anemias: Secondary | ICD-10-CM

## 2022-08-12 MED ORDER — SODIUM CHLORIDE 0.9 % IV SOLN: Freq: Once | INTRAVENOUS | Status: AC

## 2022-08-12 MED ORDER — LORATADINE 10 MG PO TABS: 10.0000 mg | ORAL_TABLET | Freq: Once | ORAL | Status: DC

## 2022-08-12 MED ORDER — SODIUM CHLORIDE 0.9 % IV SOLN
200.0000 mg | Freq: Once | INTRAVENOUS | Status: AC
  Administered 2022-08-12: 200 mg via INTRAVENOUS
  Filled 2022-08-12: qty 200

## 2022-08-12 MED ORDER — ACETAMINOPHEN 325 MG PO TABS: 650.0000 mg | ORAL_TABLET | Freq: Once | ORAL | Status: DC

## 2022-08-12 MED FILL — Iron Sucrose Inj 20 MG/ML (Fe Equiv): INTRAVENOUS | Qty: 10 | Status: AC

## 2022-08-12 NOTE — Patient Instructions (Signed)

## 2022-08-13 ENCOUNTER — Inpatient Hospital Stay: Payer: 59

## 2022-08-13 VITALS — BP 127/88 | HR 83 | Temp 98.6°F | Resp 18

## 2022-08-13 DIAGNOSIS — D500 Iron deficiency anemia secondary to blood loss (chronic): Secondary | ICD-10-CM | POA: Diagnosis not present

## 2022-08-13 DIAGNOSIS — D508 Other iron deficiency anemias: Secondary | ICD-10-CM

## 2022-08-13 MED ORDER — ACETAMINOPHEN 325 MG PO TABS: 650.0000 mg | ORAL_TABLET | Freq: Once | ORAL | Status: DC

## 2022-08-13 MED ORDER — LORATADINE 10 MG PO TABS: 10.0000 mg | ORAL_TABLET | Freq: Once | ORAL | Status: DC

## 2022-08-13 MED ORDER — SODIUM CHLORIDE 0.9 % IV SOLN: Freq: Once | INTRAVENOUS | Status: AC

## 2022-08-13 MED ORDER — SODIUM CHLORIDE 0.9 % IV SOLN
200.0000 mg | Freq: Once | INTRAVENOUS | Status: AC
  Administered 2022-08-13: 200 mg via INTRAVENOUS
  Filled 2022-08-13: qty 200

## 2022-08-13 NOTE — Patient Instructions (Signed)

## 2022-11-04 NOTE — Progress Notes (Deleted)
  [facility]  [address] [place],  [place]  [address] [phone]  Clinic Day:  11/04/2022  Referring physician: Simone Curia, MD   HISTORY OF PRESENT ILLNESS:  The patient is a 42 y.o. female  who I recently began seeing again for iron deficiency anemia.  He comes in today to reassess his iron and hemoglobin after receiving IV iron in April 2024.     PHYSICAL EXAM:  There were no vitals taken for this visit. Wt Readings from Last 3 Encounters:  08/12/22 157 lb 4 oz (71.3 kg)  08/10/22 160 lb (72.6 kg)  08/09/22 160 lb (72.6 kg)   There is no height or weight on file to calculate BMI. Performance status (ECOG): 0 - Asymptomatic Physical Exam Constitutional:      Appearance: Normal appearance. She is not ill-appearing.  HENT:     Mouth/Throat:     Mouth: Mucous membranes are moist.     Pharynx: Oropharynx is clear. No oropharyngeal exudate or posterior oropharyngeal erythema.  Cardiovascular:     Rate and Rhythm: Normal rate and regular rhythm.     Heart sounds: No murmur heard.    No friction rub. No gallop.  Pulmonary:     Effort: Pulmonary effort is normal. No respiratory distress.     Breath sounds: Normal breath sounds. No wheezing, rhonchi or rales.  Abdominal:     General: Bowel sounds are normal. There is no distension.     Palpations: Abdomen is soft. There is no mass.     Tenderness: There is no abdominal tenderness.  Musculoskeletal:        General: No swelling.     Right lower leg: No edema.     Left lower leg: No edema.  Lymphadenopathy:     Cervical: No cervical adenopathy.     Upper Body:     Right upper body: No supraclavicular or axillary adenopathy.     Left upper body: No supraclavicular or axillary adenopathy.     Lower Body: No right inguinal adenopathy. No left inguinal adenopathy.  Skin:    General: Skin is warm.     Coloration: Skin is not jaundiced.     Findings: No lesion or rash.  Neurological:      General: No focal deficit present.     Mental Status: She is alert and oriented to person, place, and time. Mental status is at baseline.  Psychiatric:        Mood and Affect: Mood normal.        Behavior: Behavior normal.        Thought Content: Thought content normal.     LABS:    Iron B!2 folate levels pending   ASSESSMENT & PLAN:  A 42 y.o. female who I was asked to re-evaluate for iron deficiency anemia.  I will arrange for her to receive IV iron next week to rapidly replenish her iron stores and normalize her hemoglobin.  She is scheduled to see her gynecologist next month, for which she knows to discuss better ways to control the heaviness of her menstrual cycles.  Otherwise, I will see her back in 3 months to reassess her iron and hemoglobin levels to see how well he/she responded to his/her upcoming IV iron.  The patient understands all the plans discussed today and is in agreement with them.   Kirby Funk, MD

## 2022-11-05 ENCOUNTER — Inpatient Hospital Stay: Payer: 59

## 2022-11-05 ENCOUNTER — Inpatient Hospital Stay: Payer: 59 | Admitting: Oncology

## 2022-11-07 NOTE — Progress Notes (Unsigned)
[facility]  [address] [place],  [place]  [address] [phone]  Clinic Day:  11/08/2022  Referring physician: Simone Curia, MD   HISTORY OF PRESENT ILLNESS:  The patient is a 42 y.o. female  with iron deficiency anemia secondary to heavy menstrual cycles.  She comes in today to reassess her hemoglobin and iron parameters after receiving a course of IV iron.  The patient claims she felt better almost immediately after receiving her IV iron.  Her menstrual cycles remain heavy, for which she is discussing treatment options with her gynecologist.  She denies having other overt forms of blood loss to explain her previous iron deficiency anemia.    PHYSICAL EXAM:  Blood pressure (!) 152/94, pulse 76, temperature 98.8 F (37.1 C), resp. rate 16, height 5' 5.5" (1.664 m), weight 154 lb 4.8 oz (70 kg), SpO2 98%. Wt Readings from Last 3 Encounters:  11/08/22 154 lb 4.8 oz (70 kg)  08/12/22 157 lb 4 oz (71.3 kg)  08/10/22 160 lb (72.6 kg)   Body mass index is 25.29 kg/m. Performance status (ECOG): 0 - Asymptomatic Physical Exam Constitutional:      Appearance: Normal appearance. She is not ill-appearing.  HENT:     Mouth/Throat:     Mouth: Mucous membranes are moist.     Pharynx: Oropharynx is clear. No oropharyngeal exudate or posterior oropharyngeal erythema.  Cardiovascular:     Rate and Rhythm: Normal rate and regular rhythm.     Heart sounds: No murmur heard.    No friction rub. No gallop.  Pulmonary:     Effort: Pulmonary effort is normal. No respiratory distress.     Breath sounds: Normal breath sounds. No wheezing, rhonchi or rales.  Abdominal:     General: Bowel sounds are normal. There is no distension.     Palpations: Abdomen is soft. There is no mass.     Tenderness: There is no abdominal tenderness.  Musculoskeletal:        General: No swelling.     Right lower leg: No edema.     Left lower leg: No edema.  Lymphadenopathy:      Cervical: No cervical adenopathy.     Upper Body:     Right upper body: No supraclavicular or axillary adenopathy.     Left upper body: No supraclavicular or axillary adenopathy.     Lower Body: No right inguinal adenopathy. No left inguinal adenopathy.  Skin:    General: Skin is warm.     Coloration: Skin is not jaundiced.     Findings: No lesion or rash.  Neurological:     General: No focal deficit present.     Mental Status: She is alert and oriented to person, place, and time. Mental status is at baseline.  Psychiatric:        Mood and Affect: Mood normal.        Behavior: Behavior normal.        Thought Content: Thought content normal.   LABS:     Latest Reference Range & Units 11/08/22 08:54  Iron 28 - 170 ug/dL 29  UIBC ug/dL 938  TIBC 101 - 751 ug/dL 025 (H)  Saturation Ratios 10.4 - 31.8 % 5 (L)  Ferritin 11 - 307 ng/mL 6 (L)  (H): Data is abnormally high (L): Data is abnormally low  ASSESSMENT & PLAN:  A 42 y.o. female with iron deficiency anemia.  I am very pleased with the improvement in her  iron and hemoglobin after receiving her IV iron.  However, her iron parameters show persistent iron deficiency.  Based upon this, I will arrange for her to receive another course of IV iron over these next few weeks to hopefully replenish her iron stores and lead to a further improved hemoglobin over time.  I will see her back in 4 months to reassess her iron and hemoglobin levels after receiving another course of IV iron.  The patient understands all the plans discussed today and is in agreement with them.  Zahlia Deshazer Kirby Funk, MD

## 2022-11-08 ENCOUNTER — Other Ambulatory Visit: Payer: Self-pay | Admitting: Oncology

## 2022-11-08 ENCOUNTER — Inpatient Hospital Stay (INDEPENDENT_AMBULATORY_CARE_PROVIDER_SITE_OTHER): Payer: 59 | Admitting: Oncology

## 2022-11-08 ENCOUNTER — Inpatient Hospital Stay: Payer: 59 | Attending: Oncology

## 2022-11-08 VITALS — BP 152/94 | HR 76 | Temp 98.8°F | Resp 16 | Ht 65.5 in | Wt 154.3 lb

## 2022-11-08 DIAGNOSIS — D500 Iron deficiency anemia secondary to blood loss (chronic): Secondary | ICD-10-CM

## 2022-11-08 DIAGNOSIS — D509 Iron deficiency anemia, unspecified: Secondary | ICD-10-CM | POA: Diagnosis present

## 2022-11-08 DIAGNOSIS — D508 Other iron deficiency anemias: Secondary | ICD-10-CM

## 2022-11-08 LAB — CBC AND DIFFERENTIAL
HCT: 33 — AB (ref 36–46)
Hemoglobin: 11.2 — AB (ref 12.0–16.0)
Neutrophils Absolute: 3.19
Platelets: 354 10*3/uL (ref 150–400)
WBC: 5.5

## 2022-11-08 LAB — IRON AND TIBC
Iron: 29 ug/dL (ref 28–170)
Saturation Ratios: 5 % — ABNORMAL LOW (ref 10.4–31.8)
TIBC: 563 ug/dL — ABNORMAL HIGH (ref 250–450)
UIBC: 534 ug/dL

## 2022-11-08 LAB — CBC: RBC: 4.03 (ref 3.87–5.11)

## 2022-11-08 LAB — FERRITIN: Ferritin: 6 ng/mL — ABNORMAL LOW (ref 11–307)

## 2022-11-09 ENCOUNTER — Telehealth: Payer: Self-pay

## 2022-11-09 ENCOUNTER — Telehealth: Payer: Self-pay | Admitting: Oncology

## 2022-11-09 NOTE — Telephone Encounter (Signed)
Patient has been scheduled. Aware of appt date and time    Scheduling Message Entered by Rennis Harding A on 11/08/2022 at  1:19 PM Priority: Routine <No visit type provided>  Department: [facility]  Provider:  Scheduling Notes:  Repeat IV iron asap  Labs/appt on 03-11-23

## 2022-11-09 NOTE — Telephone Encounter (Signed)
Patient called  regarding lab results.Some improvement noted, persistent IDA will schedule some IV iron to help improve hgb over time and follow-up appt in 4 months .Detailed messasge left for patient , patient to call with any further concerns.

## 2022-11-11 ENCOUNTER — Encounter: Payer: Self-pay | Admitting: Oncology

## 2022-11-24 MED FILL — Iron Sucrose Inj 20 MG/ML (Fe Equiv): INTRAVENOUS | Qty: 10 | Status: AC

## 2022-11-25 ENCOUNTER — Inpatient Hospital Stay: Payer: 59 | Attending: Oncology

## 2022-11-25 MED FILL — Iron Sucrose Inj 20 MG/ML (Fe Equiv): INTRAVENOUS | Qty: 10 | Status: AC

## 2022-11-26 ENCOUNTER — Encounter: Payer: Self-pay | Admitting: Oncology

## 2022-11-26 ENCOUNTER — Inpatient Hospital Stay: Payer: 59

## 2022-11-26 MED FILL — Iron Sucrose Inj 20 MG/ML (Fe Equiv): INTRAVENOUS | Qty: 10 | Status: AC

## 2022-11-29 ENCOUNTER — Encounter: Payer: Self-pay | Admitting: Oncology

## 2022-11-29 ENCOUNTER — Inpatient Hospital Stay: Payer: 59 | Attending: Oncology

## 2022-11-29 VITALS — BP 134/94 | HR 89 | Temp 97.8°F | Resp 18

## 2022-11-29 DIAGNOSIS — Z79899 Other long term (current) drug therapy: Secondary | ICD-10-CM | POA: Diagnosis not present

## 2022-11-29 DIAGNOSIS — D509 Iron deficiency anemia, unspecified: Secondary | ICD-10-CM | POA: Insufficient documentation

## 2022-11-29 DIAGNOSIS — D508 Other iron deficiency anemias: Secondary | ICD-10-CM

## 2022-11-29 MED ORDER — SODIUM CHLORIDE 0.9 % IV SOLN: Freq: Once | INTRAVENOUS | Status: AC

## 2022-11-29 MED ORDER — SODIUM CHLORIDE 0.9 % IV SOLN
200.0000 mg | Freq: Once | INTRAVENOUS | Status: AC
  Administered 2022-11-29: 200 mg via INTRAVENOUS
  Filled 2022-11-29: qty 200

## 2022-11-29 MED FILL — Iron Sucrose Inj 20 MG/ML (Fe Equiv): INTRAVENOUS | Qty: 10 | Status: AC

## 2022-11-29 NOTE — Patient Instructions (Signed)
Iron Sucrose Injection What is this medication? IRON SUCROSE (EYE ern SOO krose) treats low levels of iron (iron deficiency anemia) in people with kidney disease. Iron is a mineral that plays an important role in making red blood cells, which carry oxygen from your lungs to the rest of your body. This medicine may be used for other purposes; ask your health care provider or pharmacist if you have questions. COMMON BRAND NAME(S): Venofer What should I tell my care team before I take this medication? They need to know if you have any of these conditions: Anemia not caused by low iron levels Heart disease High levels of iron in the blood Kidney disease Liver disease An unusual or allergic reaction to iron, other medications, foods, dyes, or preservatives Pregnant or trying to get pregnant Breastfeeding How should I use this medication? This medication is for infusion into a vein. It is given in a hospital or clinic setting. Talk to your care team about the use of this medication in children. While this medication may be prescribed for children as young as 2 years for selected conditions, precautions do apply. Overdosage: If you think you have taken too much of this medicine contact a poison control center or emergency room at once. NOTE: This medicine is only for you. Do not share this medicine with others. What if I miss a dose? Keep appointments for follow-up doses. It is important not to miss your dose. Call your care team if you are unable to keep an appointment. What may interact with this medication? Do not take this medication with any of the following: Deferoxamine Dimercaprol Other iron products This medication may also interact with the following: Chloramphenicol Deferasirox This list may not describe all possible interactions. Give your health care provider a list of all the medicines, herbs, non-prescription drugs, or dietary supplements you use. Also tell them if you smoke,  drink alcohol, or use illegal drugs. Some items may interact with your medicine. What should I watch for while using this medication? Visit your care team regularly. Tell your care team if your symptoms do not start to get better or if they get worse. You may need blood work done while you are taking this medication. You may need to follow a special diet. Talk to your care team. Foods that contain iron include: whole grains/cereals, dried fruits, beans, or peas, leafy green vegetables, and organ meats (liver, kidney). What side effects may I notice from receiving this medication? Side effects that you should report to your care team as soon as possible: Allergic reactions--skin rash, itching, hives, swelling of the face, lips, tongue, or throat Low blood pressure--dizziness, feeling faint or lightheaded, blurry vision Shortness of breath Side effects that usually do not require medical attention (report to your care team if they continue or are bothersome): Flushing Headache Joint pain Muscle pain Nausea Pain, redness, or irritation at injection site This list may not describe all possible side effects. Call your doctor for medical advice about side effects. You may report side effects to FDA at 1-800-FDA-1088. Where should I keep my medication? This medication is given in a hospital or clinic and will not be stored at home. NOTE: This sheet is a summary. It may not cover all possible information. If you have questions about this medicine, talk to your doctor, pharmacist, or health care provider.  2023 Elsevier/Gold Standard (2020-07-17 00:00:00)  

## 2022-11-30 ENCOUNTER — Inpatient Hospital Stay: Payer: 59

## 2022-11-30 VITALS — BP 131/95 | HR 72 | Temp 99.1°F | Resp 18

## 2022-11-30 DIAGNOSIS — D509 Iron deficiency anemia, unspecified: Secondary | ICD-10-CM | POA: Diagnosis not present

## 2022-11-30 DIAGNOSIS — D508 Other iron deficiency anemias: Secondary | ICD-10-CM

## 2022-11-30 MED ORDER — SODIUM CHLORIDE 0.9 % IV SOLN: Freq: Once | INTRAVENOUS | Status: AC

## 2022-11-30 MED ORDER — SODIUM CHLORIDE 0.9 % IV SOLN
200.0000 mg | Freq: Once | INTRAVENOUS | Status: AC
  Administered 2022-11-30: 200 mg via INTRAVENOUS
  Filled 2022-11-30: qty 200

## 2022-11-30 MED FILL — Iron Sucrose Inj 20 MG/ML (Fe Equiv): INTRAVENOUS | Qty: 10 | Status: AC

## 2022-11-30 NOTE — Patient Instructions (Signed)
Iron Sucrose Injection What is this medication? IRON SUCROSE (EYE ern SOO krose) treats low levels of iron (iron deficiency anemia) in people with kidney disease. Iron is a mineral that plays an important role in making red blood cells, which carry oxygen from your lungs to the rest of your body. This medicine may be used for other purposes; ask your health care provider or pharmacist if you have questions. COMMON BRAND NAME(S): Venofer What should I tell my care team before I take this medication? They need to know if you have any of these conditions: Anemia not caused by low iron levels Heart disease High levels of iron in the blood Kidney disease Liver disease An unusual or allergic reaction to iron, other medications, foods, dyes, or preservatives Pregnant or trying to get pregnant Breastfeeding How should I use this medication? This medication is for infusion into a vein. It is given in a hospital or clinic setting. Talk to your care team about the use of this medication in children. While this medication may be prescribed for children as young as 2 years for selected conditions, precautions do apply. Overdosage: If you think you have taken too much of this medicine contact a poison control center or emergency room at once. NOTE: This medicine is only for you. Do not share this medicine with others. What if I miss a dose? Keep appointments for follow-up doses. It is important not to miss your dose. Call your care team if you are unable to keep an appointment. What may interact with this medication? Do not take this medication with any of the following: Deferoxamine Dimercaprol Other iron products This medication may also interact with the following: Chloramphenicol Deferasirox This list may not describe all possible interactions. Give your health care provider a list of all the medicines, herbs, non-prescription drugs, or dietary supplements you use. Also tell them if you smoke,  drink alcohol, or use illegal drugs. Some items may interact with your medicine. What should I watch for while using this medication? Visit your care team regularly. Tell your care team if your symptoms do not start to get better or if they get worse. You may need blood work done while you are taking this medication. You may need to follow a special diet. Talk to your care team. Foods that contain iron include: whole grains/cereals, dried fruits, beans, or peas, leafy green vegetables, and organ meats (liver, kidney). What side effects may I notice from receiving this medication? Side effects that you should report to your care team as soon as possible: Allergic reactions--skin rash, itching, hives, swelling of the face, lips, tongue, or throat Low blood pressure--dizziness, feeling faint or lightheaded, blurry vision Shortness of breath Side effects that usually do not require medical attention (report to your care team if they continue or are bothersome): Flushing Headache Joint pain Muscle pain Nausea Pain, redness, or irritation at injection site This list may not describe all possible side effects. Call your doctor for medical advice about side effects. You may report side effects to FDA at 1-800-FDA-1088. Where should I keep my medication? This medication is given in a hospital or clinic and will not be stored at home. NOTE: This sheet is a summary. It may not cover all possible information. If you have questions about this medicine, talk to your doctor, pharmacist, or health care provider.  2023 Elsevier/Gold Standard (2020-07-17 00:00:00)  

## 2022-12-01 ENCOUNTER — Inpatient Hospital Stay: Payer: 59

## 2022-12-01 VITALS — BP 125/87 | HR 84 | Temp 98.5°F | Resp 18

## 2022-12-01 DIAGNOSIS — D509 Iron deficiency anemia, unspecified: Secondary | ICD-10-CM | POA: Diagnosis not present

## 2022-12-01 DIAGNOSIS — D508 Other iron deficiency anemias: Secondary | ICD-10-CM

## 2022-12-01 MED ORDER — SODIUM CHLORIDE 0.9 % IV SOLN
200.0000 mg | Freq: Once | INTRAVENOUS | Status: AC
  Administered 2022-12-01: 200 mg via INTRAVENOUS
  Filled 2022-12-01: qty 200

## 2022-12-01 MED ORDER — SODIUM CHLORIDE 0.9 % IV SOLN: Freq: Once | INTRAVENOUS | Status: AC

## 2022-12-01 MED FILL — Iron Sucrose Inj 20 MG/ML (Fe Equiv): INTRAVENOUS | Qty: 10 | Status: AC

## 2022-12-01 NOTE — Patient Instructions (Signed)
Iron Sucrose Injection What is this medication? IRON SUCROSE (EYE ern SOO krose) treats low levels of iron (iron deficiency anemia) in people with kidney disease. Iron is a mineral that plays an important role in making red blood cells, which carry oxygen from your lungs to the rest of your body. This medicine may be used for other purposes; ask your health care provider or pharmacist if you have questions. COMMON BRAND NAME(S): Venofer What should I tell my care team before I take this medication? They need to know if you have any of these conditions: Anemia not caused by low iron levels Heart disease High levels of iron in the blood Kidney disease Liver disease An unusual or allergic reaction to iron, other medications, foods, dyes, or preservatives Pregnant or trying to get pregnant Breastfeeding How should I use this medication? This medication is for infusion into a vein. It is given in a hospital or clinic setting. Talk to your care team about the use of this medication in children. While this medication may be prescribed for children as young as 2 years for selected conditions, precautions do apply. Overdosage: If you think you have taken too much of this medicine contact a poison control center or emergency room at once. NOTE: This medicine is only for you. Do not share this medicine with others. What if I miss a dose? Keep appointments for follow-up doses. It is important not to miss your dose. Call your care team if you are unable to keep an appointment. What may interact with this medication? Do not take this medication with any of the following: Deferoxamine Dimercaprol Other iron products This medication may also interact with the following: Chloramphenicol Deferasirox This list may not describe all possible interactions. Give your health care provider a list of all the medicines, herbs, non-prescription drugs, or dietary supplements you use. Also tell them if you smoke,  drink alcohol, or use illegal drugs. Some items may interact with your medicine. What should I watch for while using this medication? Visit your care team regularly. Tell your care team if your symptoms do not start to get better or if they get worse. You may need blood work done while you are taking this medication. You may need to follow a special diet. Talk to your care team. Foods that contain iron include: whole grains/cereals, dried fruits, beans, or peas, leafy green vegetables, and organ meats (liver, kidney). What side effects may I notice from receiving this medication? Side effects that you should report to your care team as soon as possible: Allergic reactions--skin rash, itching, hives, swelling of the face, lips, tongue, or throat Low blood pressure--dizziness, feeling faint or lightheaded, blurry vision Shortness of breath Side effects that usually do not require medical attention (report to your care team if they continue or are bothersome): Flushing Headache Joint pain Muscle pain Nausea Pain, redness, or irritation at injection site This list may not describe all possible side effects. Call your doctor for medical advice about side effects. You may report side effects to FDA at 1-800-FDA-1088. Where should I keep my medication? This medication is given in a hospital or clinic and will not be stored at home. NOTE: This sheet is a summary. It may not cover all possible information. If you have questions about this medicine, talk to your doctor, pharmacist, or health care provider.  2023 Elsevier/Gold Standard (2020-07-17 00:00:00)  

## 2022-12-02 ENCOUNTER — Inpatient Hospital Stay: Payer: 59

## 2022-12-02 VITALS — BP 133/97 | HR 76 | Temp 98.4°F | Resp 18

## 2022-12-02 DIAGNOSIS — D509 Iron deficiency anemia, unspecified: Secondary | ICD-10-CM | POA: Diagnosis not present

## 2022-12-02 DIAGNOSIS — D508 Other iron deficiency anemias: Secondary | ICD-10-CM

## 2022-12-02 MED ORDER — SODIUM CHLORIDE 0.9 % IV SOLN: Freq: Once | INTRAVENOUS | Status: AC

## 2022-12-02 MED ORDER — SODIUM CHLORIDE 0.9 % IV SOLN
200.0000 mg | Freq: Once | INTRAVENOUS | Status: AC
  Administered 2022-12-02: 200 mg via INTRAVENOUS
  Filled 2022-12-02: qty 200

## 2022-12-02 MED FILL — Iron Sucrose Inj 20 MG/ML (Fe Equiv): INTRAVENOUS | Qty: 10 | Status: AC

## 2022-12-02 NOTE — Patient Instructions (Signed)
Iron Sucrose Injection What is this medication? IRON SUCROSE (EYE ern SOO krose) treats low levels of iron (iron deficiency anemia) in people with kidney disease. Iron is a mineral that plays an important role in making red blood cells, which carry oxygen from your lungs to the rest of your body. This medicine may be used for other purposes; ask your health care provider or pharmacist if you have questions. COMMON BRAND NAME(S): Venofer What should I tell my care team before I take this medication? They need to know if you have any of these conditions: Anemia not caused by low iron levels Heart disease High levels of iron in the blood Kidney disease Liver disease An unusual or allergic reaction to iron, other medications, foods, dyes, or preservatives Pregnant or trying to get pregnant Breastfeeding How should I use this medication? This medication is for infusion into a vein. It is given in a hospital or clinic setting. Talk to your care team about the use of this medication in children. While this medication may be prescribed for children as young as 2 years for selected conditions, precautions do apply. Overdosage: If you think you have taken too much of this medicine contact a poison control center or emergency room at once. NOTE: This medicine is only for you. Do not share this medicine with others. What if I miss a dose? Keep appointments for follow-up doses. It is important not to miss your dose. Call your care team if you are unable to keep an appointment. What may interact with this medication? Do not take this medication with any of the following: Deferoxamine Dimercaprol Other iron products This medication may also interact with the following: Chloramphenicol Deferasirox This list may not describe all possible interactions. Give your health care provider a list of all the medicines, herbs, non-prescription drugs, or dietary supplements you use. Also tell them if you smoke,  drink alcohol, or use illegal drugs. Some items may interact with your medicine. What should I watch for while using this medication? Visit your care team regularly. Tell your care team if your symptoms do not start to get better or if they get worse. You may need blood work done while you are taking this medication. You may need to follow a special diet. Talk to your care team. Foods that contain iron include: whole grains/cereals, dried fruits, beans, or peas, leafy green vegetables, and organ meats (liver, kidney). What side effects may I notice from receiving this medication? Side effects that you should report to your care team as soon as possible: Allergic reactions--skin rash, itching, hives, swelling of the face, lips, tongue, or throat Low blood pressure--dizziness, feeling faint or lightheaded, blurry vision Shortness of breath Side effects that usually do not require medical attention (report to your care team if they continue or are bothersome): Flushing Headache Joint pain Muscle pain Nausea Pain, redness, or irritation at injection site This list may not describe all possible side effects. Call your doctor for medical advice about side effects. You may report side effects to FDA at 1-800-FDA-1088. Where should I keep my medication? This medication is given in a hospital or clinic and will not be stored at home. NOTE: This sheet is a summary. It may not cover all possible information. If you have questions about this medicine, talk to your doctor, pharmacist, or health care provider.  2023 Elsevier/Gold Standard (2020-07-17 00:00:00)  

## 2022-12-03 ENCOUNTER — Inpatient Hospital Stay: Payer: 59

## 2022-12-08 ENCOUNTER — Encounter: Payer: Self-pay | Admitting: Oncology

## 2022-12-08 MED FILL — Iron Sucrose Inj 20 MG/ML (Fe Equiv): INTRAVENOUS | Qty: 10 | Status: AC

## 2022-12-09 ENCOUNTER — Inpatient Hospital Stay: Payer: 59

## 2022-12-09 ENCOUNTER — Encounter: Payer: Self-pay | Admitting: Oncology

## 2022-12-09 MED FILL — Iron Sucrose Inj 20 MG/ML (Fe Equiv): INTRAVENOUS | Qty: 10 | Status: AC

## 2022-12-10 ENCOUNTER — Inpatient Hospital Stay: Payer: 59

## 2022-12-10 VITALS — BP 140/89 | HR 86 | Temp 98.9°F | Resp 14 | Ht 65.5 in | Wt 159.0 lb

## 2022-12-10 DIAGNOSIS — D509 Iron deficiency anemia, unspecified: Secondary | ICD-10-CM | POA: Diagnosis not present

## 2022-12-10 DIAGNOSIS — D508 Other iron deficiency anemias: Secondary | ICD-10-CM

## 2022-12-10 MED ORDER — SODIUM CHLORIDE 0.9 % IV SOLN: Freq: Once | INTRAVENOUS | Status: AC

## 2022-12-10 MED ORDER — SODIUM CHLORIDE 0.9 % IV SOLN
200.0000 mg | Freq: Once | INTRAVENOUS | Status: AC
  Administered 2022-12-10: 200 mg via INTRAVENOUS
  Filled 2022-12-10: qty 200

## 2023-03-11 ENCOUNTER — Inpatient Hospital Stay: Payer: 59

## 2023-03-11 ENCOUNTER — Inpatient Hospital Stay: Payer: 59 | Attending: Oncology | Admitting: Oncology

## 2023-03-11 NOTE — Progress Notes (Deleted)
[facility]  [address] [place],  [place]  [address] [phone]  Clinic Day:  03/11/2023  Referring physician: Simone Curia, MD   HISTORY OF PRESENT ILLNESS:  The patient is a 42 y.o. female  with iron deficiency anemia secondary to heavy menstrual cycles.  She comes in today to reassess her hemoglobin and iron parameters after receiving a course of IV iron.  The patient claims she felt better almost immediately after receiving her IV iron.  Her menstrual cycles remain heavy, for which she is discussing treatment options with her gynecologist.  She denies having other overt forms of blood loss to explain her previous iron deficiency anemia.    PHYSICAL EXAM:  There were no vitals taken for this visit. Wt Readings from Last 3 Encounters:  12/10/22 159 lb (72.1 kg)  11/08/22 154 lb 4.8 oz (70 kg)  08/12/22 157 lb 4 oz (71.3 kg)   There is no height or weight on file to calculate BMI. Performance status (ECOG): 0 - Asymptomatic Physical Exam Constitutional:      Appearance: Normal appearance. She is not ill-appearing.  HENT:     Mouth/Throat:     Mouth: Mucous membranes are moist.     Pharynx: Oropharynx is clear. No oropharyngeal exudate or posterior oropharyngeal erythema.  Cardiovascular:     Rate and Rhythm: Normal rate and regular rhythm.     Heart sounds: No murmur heard.    No friction rub. No gallop.  Pulmonary:     Effort: Pulmonary effort is normal. No respiratory distress.     Breath sounds: Normal breath sounds. No wheezing, rhonchi or rales.  Abdominal:     General: Bowel sounds are normal. There is no distension.     Palpations: Abdomen is soft. There is no mass.     Tenderness: There is no abdominal tenderness.  Musculoskeletal:        General: No swelling.     Right lower leg: No edema.     Left lower leg: No edema.  Lymphadenopathy:     Cervical: No cervical adenopathy.     Upper Body:     Right upper body: No  supraclavicular or axillary adenopathy.     Left upper body: No supraclavicular or axillary adenopathy.     Lower Body: No right inguinal adenopathy. No left inguinal adenopathy.  Skin:    General: Skin is warm.     Coloration: Skin is not jaundiced.     Findings: No lesion or rash.  Neurological:     General: No focal deficit present.     Mental Status: She is alert and oriented to person, place, and time. Mental status is at baseline.  Psychiatric:        Mood and Affect: Mood normal.        Behavior: Behavior normal.        Thought Content: Thought content normal.    LABS:     Latest Reference Range & Units 11/08/22 08:54  Iron 28 - 170 ug/dL 29  UIBC ug/dL 938  TIBC 101 - 751 ug/dL 025 (H)  Saturation Ratios 10.4 - 31.8 % 5 (L)  Ferritin 11 - 307 ng/mL 6 (L)  (H): Data is abnormally high (L): Data is abnormally low  ASSESSMENT & PLAN:  A 43 y.o. female with iron deficiency anemia.  I am very pleased with the improvement in her iron and hemoglobin after receiving her IV iron.  However, her iron parameters show  persistent iron deficiency.  Based upon this, I will arrange for her to receive another course of IV iron over these next few weeks to hopefully replenish her iron stores and lead to a further improved hemoglobin over time.  I will see her back in 4 months to reassess her iron and hemoglobin levels after receiving another course of IV iron.  The patient understands all the plans discussed today and is in agreement with them.  Kline Bulthuis Kirby Funk, MD

## 2023-10-13 LAB — LAB REPORT - SCANNED: EGFR: 99

## 2023-10-17 ENCOUNTER — Other Ambulatory Visit: Payer: Self-pay | Admitting: Oncology

## 2023-10-17 DIAGNOSIS — D508 Other iron deficiency anemias: Secondary | ICD-10-CM

## 2023-10-17 NOTE — Progress Notes (Deleted)
 [facility]  [address] [place],  [place]  [address] [phone]  Clinic Day:  10/17/2023  Referring physician: Jama Chow, MD   HISTORY OF PRESENT ILLNESS:  The patient is a 43 y.o. female  with iron  deficiency anemia secondary to heavy menstrual cycles.  She comes in today to reassess her hemoglobin and iron  parameters after receiving a course of IV iron .  The patient claims she felt better almost immediately after receiving her IV iron .  Her menstrual cycles remain heavy, for which she is discussing treatment options with her gynecologist.  She denies having other overt forms of blood loss to explain her previous iron  deficiency anemia.    PHYSICAL EXAM:  There were no vitals taken for this visit. Wt Readings from Last 3 Encounters:  12/10/22 159 lb (72.1 kg)  11/08/22 154 lb 4.8 oz (70 kg)  08/12/22 157 lb 4 oz (71.3 kg)   There is no height or weight on file to calculate BMI. Performance status (ECOG): 0 - Asymptomatic Physical Exam Constitutional:      Appearance: Normal appearance. She is not ill-appearing.  HENT:     Mouth/Throat:     Mouth: Mucous membranes are moist.     Pharynx: Oropharynx is clear. No oropharyngeal exudate or posterior oropharyngeal erythema.   Cardiovascular:     Rate and Rhythm: Normal rate and regular rhythm.     Heart sounds: No murmur heard.    No friction rub. No gallop.  Pulmonary:     Effort: Pulmonary effort is normal. No respiratory distress.     Breath sounds: Normal breath sounds. No wheezing, rhonchi or rales.  Abdominal:     General: Bowel sounds are normal. There is no distension.     Palpations: Abdomen is soft. There is no mass.     Tenderness: There is no abdominal tenderness.   Musculoskeletal:        General: No swelling.     Right lower leg: No edema.     Left lower leg: No edema.  Lymphadenopathy:     Cervical: No cervical adenopathy.     Upper Body:     Right upper body: No  supraclavicular or axillary adenopathy.     Left upper body: No supraclavicular or axillary adenopathy.     Lower Body: No right inguinal adenopathy. No left inguinal adenopathy.   Skin:    General: Skin is warm.     Coloration: Skin is not jaundiced.     Findings: No lesion or rash.   Neurological:     General: No focal deficit present.     Mental Status: She is alert and oriented to person, place, and time. Mental status is at baseline.   Psychiatric:        Mood and Affect: Mood normal.        Behavior: Behavior normal.        Thought Content: Thought content normal.   LABS:     Latest Reference Range & Units 11/08/22 08:54  Iron  28 - 170 ug/dL 29  UIBC ug/dL 465  TIBC 749 - 549 ug/dL 436 (H)  Saturation Ratios 10.4 - 31.8 % 5 (L)  Ferritin 11 - 307 ng/mL 6 (L)  (H): Data is abnormally high (L): Data is abnormally low  ASSESSMENT & PLAN:  A 43 y.o. female with iron  deficiency anemia.  I am very pleased with the improvement in her iron  and hemoglobin after receiving her IV iron .  However,  her iron  parameters show persistent iron  deficiency.  Based upon this, I will arrange for her to receive another course of IV iron  over these next few weeks to hopefully replenish her iron  stores and lead to a further improved hemoglobin over time.  I will see her back in 4 months to reassess her iron  and hemoglobin levels after receiving another course of IV iron .  The patient understands all the plans discussed today and is in agreement with them.  Gordon Carlson DELENA Kerns, MD

## 2023-10-17 NOTE — Progress Notes (Deleted)
  [facility]  [address] [place],  [place]  [address] [phone]  Clinic Day:  10/17/2023  Referring physician: Jama Chow, MD   HISTORY OF PRESENT ILLNESS:  The patient is a 43 y.o. female with iron  deficiency anemia secondary to heavy menstrual cycles.  She last received IV iron  in August 2024 to replenish her iron  stores and improve her hemoglobin.  She comes in today to reassess her hemoglobin and iron  parameters after receiving a course of IV iron .  The patient claims she felt better almost immediately after receiving her IV iron .  Her menstrual cycles remain heavy, for which she is discussing treatment options with her gynecologist.  She denies having other overt forms of blood loss to explain her previous iron  deficiency anemia.      PHYSICAL EXAM:  There were no vitals taken for this visit. Wt Readings from Last 3 Encounters:  12/10/22 159 lb (72.1 kg)  11/08/22 154 lb 4.8 oz (70 kg)  08/12/22 157 lb 4 oz (71.3 kg)   There is no height or weight on file to calculate BMI. Performance status (ECOG): {CHL ONC [id number] Physical Exam  LABS:      Latest Ref Rng & Units 11/08/2022   12:00 AM 08/06/2022   12:00 AM  CBC  WBC  5.5     5.3      Hemoglobin 12.0 - 16.0 11.2     7.6      Hematocrit 36 - 46 33     24      Platelets 150 - 400 K/uL 354     293         This result is from an external source.       No data to display           No results found for: CEA1, CEA / No results found for: CEA1, CEA No results found for: PSA1 No results found for: CAN199 No results found for: CAN125  No results found for: TOTALPROTELP, ALBUMINELP, A1GS, A2GS, BETS, BETA2SER, GAMS, MSPIKE, SPEI Lab Results  Component Value Date   TIBC 563 (H) 11/08/2022   TIBC 566 (H) 08/06/2022   FERRITIN 6 (L) 11/08/2022   FERRITIN 2 (L) 08/06/2022   IRONPCTSAT 5 (L) 11/08/2022   IRONPCTSAT 3 (L) 08/06/2022   No  results found for: LDH  No results found for: AFPTUMOR, TOTALPROTELP, ALBUMINELP, A1GS, A2GS, BETS, BETA2SER, GAMS, MSPIKE, SPEI, LDH, CEA1, CEA, PSA1, IGASERUM, IGGSERUM, IGMSERUM, THGAB, THYROGLB  Review Flowsheet       Latest Ref Rng & Units 08/06/2022 11/08/2022  Oncology Labs  Ferritin 11 - 307 ng/mL 2  6   %SAT 10.4 - 31.8 % 3  5      STUDIES:  No results found.    ASSESSMENT & PLAN:   Assessment/Plan:  A 43 y.o. female with *** .The patient understands all the plans discussed today and is in agreement with them.      Alexie Samson DELENA Kerns, MD

## 2023-10-18 ENCOUNTER — Inpatient Hospital Stay

## 2023-10-18 ENCOUNTER — Inpatient Hospital Stay: Admitting: Oncology

## 2023-10-24 NOTE — Progress Notes (Unsigned)
 [facility]  [address] [place],  [place]  [address] [phone]  Clinic Day:  10/25/2023  Referring physician: Jama Chow, MD   HISTORY OF PRESENT ILLNESS:  The patient is a 43 y.o. female with iron  deficiency anemia secondary to heavy menstrual cycles.  She last received IV iron  in August 2024 to replenish her iron  stores and improve her hemoglobin.  She comes in today to reassess her hemoglobin and iron  parameters.  Since her last visit, the patient claims her menstrual cycles remain very heavy.  She is strongly considering undergoing a hysterectomy.  She denies having other overt forms of blood loss.  She also has noticed herself to be more fatigued and short of breath after walking prolonged distances.  PHYSICAL EXAM:  Blood pressure 136/86, pulse 76, temperature 99.3 F (37.4 C), temperature source Oral, resp. rate 18, height 5' 5.5 (1.664 m), weight 146 lb 9.6 oz (66.5 kg), SpO2 100%. Wt Readings from Last 3 Encounters:  10/25/23 146 lb 9.6 oz (66.5 kg)  12/10/22 159 lb (72.1 kg)  11/08/22 154 lb 4.8 oz (70 kg)   Body mass index is 24.02 kg/m. Performance status (ECOG): 0 - Asymptomatic Physical Exam Constitutional:      Appearance: Normal appearance. She is not ill-appearing.  HENT:     Mouth/Throat:     Mouth: Mucous membranes are moist.     Pharynx: Oropharynx is clear. No oropharyngeal exudate or posterior oropharyngeal erythema.  Cardiovascular:     Rate and Rhythm: Normal rate and regular rhythm.     Heart sounds: No murmur heard.    No friction rub. No gallop.  Pulmonary:     Effort: Pulmonary effort is normal. No respiratory distress.     Breath sounds: Normal breath sounds. No wheezing, rhonchi or rales.  Abdominal:     General: Bowel sounds are normal. There is no distension.     Palpations: Abdomen is soft. There is no mass.     Tenderness: There is no abdominal tenderness.  Musculoskeletal:        General: No  swelling.     Right lower leg: No edema.     Left lower leg: No edema.  Lymphadenopathy:     Cervical: No cervical adenopathy.     Upper Body:     Right upper body: No supraclavicular or axillary adenopathy.     Left upper body: No supraclavicular or axillary adenopathy.     Lower Body: No right inguinal adenopathy. No left inguinal adenopathy.  Skin:    General: Skin is warm.     Coloration: Skin is not jaundiced.     Findings: No lesion or rash.  Neurological:     General: No focal deficit present.     Mental Status: She is alert and oriented to person, place, and time. Mental status is at baseline.  Psychiatric:        Mood and Affect: Mood normal.        Behavior: Behavior normal.        Thought Content: Thought content normal.     LABS:  Labs at her primary care office in June 2025 showed a low hemoglobin of 6.8, low hematocrit 25.0, low MCV 71; her iron  studies showed a low ferritin of 4, a low serum iron  of 10, an elevated total iron  binding capacity of 454, and a low iron  saturation of 2%.     Latest Ref Rng & Units 11/08/2022   12:00 AM  08/06/2022   12:00 AM  CBC  WBC  5.5     5.3      Hemoglobin 12.0 - 16.0 11.2     7.6      Hematocrit 36 - 46 33     24      Platelets 150 - 400 K/uL 354     293         This result is from an external source.   ASSESSMENT & PLAN:  Assessment/Plan:  A 43 y.o. female whose recent labs are clearly consistent with recurrent iron  deficiency anemia being present.  This is undoubtedly related to her heavy menstrual cycles.  I will arrange for her to receive IV iron  before the end of the week to help replenish her iron  stores.  As her hemoglobin is below 7, I will also arrange for her to receive a unit of blood this week.  She knows to speak with her gynecologist about how to best manage her heavy menstrual cycles moving forward, even if that includes considering a hysterectomy.  Otherwise, I will see her back in 3 months to reassess her iron   and hemoglobin levels to see how well she responded to the aforementioned interventions.  The patient understands all the plans discussed today and is in agreement with them.      Abayomi Pattison DELENA Kerns, MD

## 2023-10-25 ENCOUNTER — Other Ambulatory Visit: Payer: Self-pay

## 2023-10-25 ENCOUNTER — Telehealth: Payer: Self-pay | Admitting: Oncology

## 2023-10-25 ENCOUNTER — Inpatient Hospital Stay

## 2023-10-25 ENCOUNTER — Inpatient Hospital Stay: Attending: Oncology | Admitting: Oncology

## 2023-10-25 ENCOUNTER — Other Ambulatory Visit: Payer: Self-pay | Admitting: Oncology

## 2023-10-25 VITALS — BP 136/86 | HR 76 | Temp 99.3°F | Resp 18 | Ht 65.5 in | Wt 146.6 lb

## 2023-10-25 DIAGNOSIS — D509 Iron deficiency anemia, unspecified: Secondary | ICD-10-CM

## 2023-10-25 DIAGNOSIS — Z79899 Other long term (current) drug therapy: Secondary | ICD-10-CM | POA: Insufficient documentation

## 2023-10-25 DIAGNOSIS — N920 Excessive and frequent menstruation with regular cycle: Secondary | ICD-10-CM | POA: Insufficient documentation

## 2023-10-25 DIAGNOSIS — R0602 Shortness of breath: Secondary | ICD-10-CM | POA: Insufficient documentation

## 2023-10-25 DIAGNOSIS — D500 Iron deficiency anemia secondary to blood loss (chronic): Secondary | ICD-10-CM | POA: Insufficient documentation

## 2023-10-25 DIAGNOSIS — R5383 Other fatigue: Secondary | ICD-10-CM | POA: Insufficient documentation

## 2023-10-25 DIAGNOSIS — D508 Other iron deficiency anemias: Secondary | ICD-10-CM

## 2023-10-25 LAB — PREPARE RBC (CROSSMATCH)

## 2023-10-25 LAB — ABO/RH: ABO/RH(D): B POS

## 2023-10-25 NOTE — Telephone Encounter (Signed)
 Patient has been scheduled for follow-up visit per 10/25/23 LOS.  Pt given an appt calendar with date and time.

## 2023-10-26 ENCOUNTER — Inpatient Hospital Stay

## 2023-10-26 DIAGNOSIS — D509 Iron deficiency anemia, unspecified: Secondary | ICD-10-CM

## 2023-10-26 DIAGNOSIS — D500 Iron deficiency anemia secondary to blood loss (chronic): Secondary | ICD-10-CM | POA: Diagnosis not present

## 2023-10-26 MED ORDER — ACETAMINOPHEN 325 MG PO TABS: 650.0000 mg | ORAL_TABLET | Freq: Once | ORAL | Status: DC

## 2023-10-26 MED ORDER — SODIUM CHLORIDE 0.9% IV SOLUTION
250.0000 mL | INTRAVENOUS | Status: DC
  Administered 2023-10-26: 250 mL via INTRAVENOUS

## 2023-10-26 MED ORDER — DIPHENHYDRAMINE HCL 25 MG PO CAPS: 25.0000 mg | ORAL_CAPSULE | Freq: Once | ORAL | Status: DC

## 2023-10-27 LAB — TYPE AND SCREEN
ABO/RH(D): B POS
Antibody Screen: NEGATIVE
Unit division: 0

## 2023-10-27 LAB — BPAM RBC
Blood Product Expiration Date: 202508042359
ISSUE DATE / TIME: 202507090802
Unit Type and Rh: 202508042359
Unit Type and Rh: 7300

## 2023-10-28 ENCOUNTER — Other Ambulatory Visit: Payer: Self-pay | Admitting: Pharmacist

## 2023-10-28 ENCOUNTER — Ambulatory Visit

## 2023-10-28 NOTE — Progress Notes (Signed)
 Monoferric orders removed since we are out of network with patient's insurance.  Pt agreed to contact [facility] to arrange appts for iron  deficiency treatment.

## 2023-11-01 ENCOUNTER — Inpatient Hospital Stay

## 2024-01-25 ENCOUNTER — Other Ambulatory Visit

## 2024-01-25 ENCOUNTER — Ambulatory Visit: Admitting: Oncology

## 2024-03-13 ENCOUNTER — Emergency Department

## 2024-03-13 ENCOUNTER — Encounter: Payer: Self-pay | Admitting: Emergency Medicine

## 2024-03-13 ENCOUNTER — Emergency Department
Admission: EM | Admit: 2024-03-13 | Discharge: 2024-03-13 | Disposition: A | Discharge status: Home / Self Care | Attending: Emergency Medicine | Admitting: Emergency Medicine

## 2024-03-13 ENCOUNTER — Other Ambulatory Visit: Payer: Self-pay

## 2024-03-13 DIAGNOSIS — S60211A Contusion of right wrist, initial encounter: Secondary | ICD-10-CM | POA: Insufficient documentation

## 2024-03-13 DIAGNOSIS — Y92410 Unspecified street and highway as the place of occurrence of the external cause: Secondary | ICD-10-CM | POA: Insufficient documentation

## 2024-03-13 DIAGNOSIS — R519 Headache, unspecified: Secondary | ICD-10-CM | POA: Diagnosis not present

## 2024-03-13 DIAGNOSIS — S40011A Contusion of right shoulder, initial encounter: Secondary | ICD-10-CM | POA: Diagnosis present

## 2024-03-13 MED ORDER — ACETAMINOPHEN 325 MG PO TABS
650.0000 mg | ORAL_TABLET | Freq: Once | ORAL | Status: AC
  Administered 2024-03-13: 650 mg via ORAL
  Filled 2024-03-13: qty 2

## 2024-03-13 MED ORDER — LIDOCAINE 5 % EX PTCH
1.0000 | MEDICATED_PATCH | CUTANEOUS | Status: DC
  Administered 2024-03-13: 1 via TRANSDERMAL
  Filled 2024-03-13: qty 1
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
## 2024-03-13 NOTE — Discharge Instructions (Signed)
 Your x-rays and CT scans are normal.  You may take Tylenol /ibuprofen per package instructions to help with your symptoms.  Please return for any new, worsening, or changing symptoms or other concerns.  It was a pleasure caring for you today.

## 2024-05-03 NOTE — H&P (Signed)
 " Ms. Kimberly Kramer is a 44 y.o. female here for [facility] and BS for history of menorrhagia  . History of Present Illness Kimberly Kramer is a 44 year old female with anemia and menorrhagia who presents for evaluation of a hysterectomy.   She has experienced severe menorrhagia for over two years, characterized by bleeding for approximately two weeks each month with significant blood clots. This has resulted in severe anemia, with hemoglobin levels dropping as low as 4 g/dL, necessitating iron  and blood infusions.   Her gynecological history includes an ectopic pregnancy in 2007 or 2008, and two subsequent pregnancies resulting in one vaginal delivery and one cesarean section without complications. No family history of gynecologic cancers.   Recent diagnostic workup includes an ultrasound performed at [facility], which identified two fibroids, each approximately 10 millimeters in size, and small ovaries. Occasional pelvic pain is associated with her menstrual periods. Her Pap smear is up to date and normal. She was schedule to undergo a TLH with her other GYN , but insurance changed   Past Medical History:  has a past medical history of Anemia, Fibroid, and Hypertension.  Past Surgical History:  has a past surgical history that includes Breast biopsy. Family History: family history includes High blood pressure (Hypertension) in her mother. Social History:  reports that she has never smoked. She has never used smokeless tobacco. She reports that she does not currently use alcohol after a past usage of about 3.0 standard drinks of alcohol per week. She reports that she does not use drugs. OB/GYN History:  OB History       Gravida  3   Para      Term      Preterm      AB  1   Living  2        SAB      IAB      Ectopic  1   Molar      Multiple      Live Births                Allergies: has no allergies on file. Medications:  Current Medications    Current Outpatient Medications:     amLODIPine (NORVASC) 5 MG tablet, Take 5 mg by mouth once daily, Disp: , Rfl:    ferrous sulfate 325 (65 FE) MG tablet, Take 325 mg by mouth once daily, Disp: , Rfl:    multivitamin tablet, Take 1 tablet by mouth once daily, Disp: , Rfl:      Review of Systems: General:                      No fatigue or weight loss Eyes:                           No vision changes Ears:                            No hearing difficulty Respiratory:                No cough or shortness of breath Pulmonary:                  No asthma or shortness of breath Cardiovascular:           No chest pain, palpitations, dyspnea on exertion Gastrointestinal:          No  abdominal bloating, chronic diarrhea, constipations, masses, pain or hematochezia Genitourinary:             No hematuria, dysuria, abnormal vaginal discharge, pelvic pain, Menometrorrhagia Lymphatic:                   No swollen lymph nodes Musculoskeletal:No muscle weakness Neurologic:                  No extremity weakness, syncope, seizure disorder Psychiatric:                  No history of depression, delusions or suicidal/homicidal ideation      Exam:       Vitals:    05/10/24   1537  BP: 128/78  Pulse: 89      Body mass index is 24.24 kg/m.   WDWN / black female in NAD   Lungs: CTA  CV : RRR without murmur   Neck:  no thyromegaly Abdomen: soft , no mass, normal active bowel sounds,  non-tender, no rebound tenderness Pelvic: tanner stage 5 ,  External genitalia: vulva /labia no lesions Urethra: no prolapse Vagina: normal physiologic d/c adequate room for TVH  Cervix: no lesions, no cervical motion tenderness   Uterus: normal size shape and contour, non-tender Adnexa: no mass,  non-tender   Rectovaginal: Impression:    The primary encounter diagnosis was Menorrhagia with regular cycle. A diagnosis of Iron  deficiency anemia due to chronic blood loss was also pertinent to this visit.       Plan:    Assessment &  Plan Abnormal Uterine Bleeding Chronic heavy menstrual bleeding with clots, likely due to fibroids. Insurance issues delayed hysterectomy. - Proceed with hysterectomy based on examination findings. Adequate room TVH . Procedure described    Uterine Fibroids Two small fibroids identified, contributing to bleeding.   Iron  Deficiency Anemia Secondary to chronic blood loss from heavy menstrual bleeding. Managed by hematologist.   Risks of the procedure see [facility] notes      No follow-ups on file.   DEBBY CLARYCE DINSMORE, MD     This note has been created using automated tools and reviewed for accuracy by [facility].      Contains text generated by [employer]  Electronically signed by Brenya Taulbee, Debby Claryce, MD    "

## 2024-05-18 ENCOUNTER — Inpatient Hospital Stay: Admission: RE | Admit: 2024-05-18 | Source: Ambulatory Visit

## 2024-05-23 ENCOUNTER — Other Ambulatory Visit (HOSPITAL_COMMUNITY): Payer: Self-pay | Admitting: Obstetrics and Gynecology

## 2024-05-23 ENCOUNTER — Other Ambulatory Visit: Payer: Self-pay | Admitting: Obstetrics and Gynecology

## 2024-05-23 DIAGNOSIS — D500 Iron deficiency anemia secondary to blood loss (chronic): Secondary | ICD-10-CM

## 2024-05-23 NOTE — Progress Notes (Signed)
 Benefits and risks to surgery:tvh and BS   The proposed benefit of the surgery has been discussed with the patient. The possible risks include, but are not limited to: organ injury to the bowel , bladder, ureters, and major blood vessels and nerves. There is a possibility of additional surgeries resulting from these injuries. There is also the risk of blood transfusion and the need to receive blood products during or after the procedure which may rarely lead to HIV or Hepatitis C infection. There is a risk of developing a deep venous thrombosis or a pulmonary embolism . There is the possibility of wound infection and also anesthetic complications, even the rare possibility of death. The patient understands these risks and wishes to proceed. All questions have been answered and the consent has been signed.

## 2024-05-23 NOTE — Progress Notes (Signed)
 Received referral for IV Venofer  300mg  x 1 dose before procedure on 05/31/2024  SOC: [place]  Sherry Pennant, PharmD, MPH, BCPS, CPP Clinical Pharmacist

## 2024-05-23 NOTE — Progress Notes (Unsigned)
 300 mg venofer  x 1 before her surgery next week

## 2024-05-24 ENCOUNTER — Other Ambulatory Visit: Payer: Self-pay

## 2024-05-24 ENCOUNTER — Encounter: Payer: Self-pay | Admitting: Obstetrics and Gynecology

## 2024-05-24 ENCOUNTER — Inpatient Hospital Stay
Admission: RE | Admit: 2024-05-24 | Discharge: 2024-05-24 | Disposition: A | Source: Ambulatory Visit | Attending: Obstetrics and Gynecology | Admitting: Obstetrics and Gynecology

## 2024-05-24 ENCOUNTER — Telehealth: Payer: Self-pay

## 2024-05-24 VITALS — Ht 66.0 in | Wt 150.0 lb

## 2024-05-24 DIAGNOSIS — Z01810 Encounter for preprocedural cardiovascular examination: Secondary | ICD-10-CM

## 2024-05-24 DIAGNOSIS — I10 Essential (primary) hypertension: Secondary | ICD-10-CM

## 2024-05-24 HISTORY — DX: Essential (primary) hypertension: I10

## 2024-05-24 HISTORY — DX: Benign neoplasm of connective and other soft tissue, unspecified: D21.9

## 2024-05-24 HISTORY — DX: Excessive and frequent menstruation with regular cycle: N92.0

## 2024-05-24 HISTORY — DX: Gastro-esophageal reflux disease without esophagitis: K21.9

## 2024-05-24 HISTORY — DX: Anemia, unspecified: D64.9

## 2024-05-24 NOTE — Progress Notes (Addendum)
 Patient is scheduled for Iv Venofer  300 mg, patient coming in Wednesday 2/11 at 9am then getting labs, and EKG for pre surgery.   Spoke with Sam in [facility], she will call patient and see if she can come in Friday 05/25/24 for Venofer  infusion. And will have patient come in next week for labs.

## 2024-05-24 NOTE — Telephone Encounter (Signed)
 Auth Submission: NO AUTH NEEDED Site of care: Site of care: [facility] Payer: [employer] commercial Medication & CPT/J Code(s) submitted: Venofer  (Iron  Sucrose) J1756 Diagnosis Code:  Route of submission (phone, fax, portal): phone Phone # [phone] Fax # Auth type: Buy/Bill PB Units/visits requested: 300mg  x 1 dose Reference number: [id number] Approval from: 05/24/24 to 09/16/24

## 2024-05-24 NOTE — Patient Instructions (Addendum)
 Your procedure is scheduled on: Thursday 05/31/24  Report to the Registration Desk on the 1st floor of the [facility]. To find out your arrival time, please call [phone] between 1PM - 3PM on: Wednesday 05/30/24 If your arrival time is 6:00 am, do not arrive before that time as the Medical Mall entrance doors do not open until 6:00 am.  REMEMBER: Instructions that are not followed completely may result in serious medical risk, up to and including death; or upon the discretion of your surgeon and anesthesiologist your surgery may need to be rescheduled.  Do not eat food after midnight the night before surgery.  No gum chewing or hard candies.  You may however, drink CLEAR liquids up to 2 hours before you are scheduled to arrive for your surgery. Do not drink anything within 2 hours of your scheduled arrival time.  Clear liquids include: - water  - apple juice without pulp - black coffee or tea (Do NOT add milk or creamers to the coffee or tea) Do NOT drink anything that is not on this list.  In addition, your doctor has ordered for you to drink the provided:  Ensure Pre-Surgery Clear Carbohydrate Drink  Drinking this carbohydrate drink up to two hours before surgery helps to reduce insulin resistance and improve patient outcomes. Please complete drinking 2 hours before scheduled arrival time.  One week prior to surgery: Stop Anti-inflammatories (NSAIDS) such as Advil, Aleve , Ibuprofen, Motrin, Naproxen , Naprosyn  and Aspirin based products such as Excedrin, Goody's Powder, BC Powder. Stop ANY OVER THE COUNTER supplements until after surgery.  You may however, continue to take Tylenol  if needed for pain up until the day of surgery.  Continue taking all of your other prescription medications up until the day of surgery.  ON THE DAY OF SURGERY ONLY TAKE THESE MEDICATIONS WITH SIPS OF WATER:  amLODipine (NORVASC) 10 MG  pantoprazole (PROTONIX) 40 MG   No Alcohol for 24 hours  before or after surgery.  No Smoking including e-cigarettes for 24 hours before surgery.  No chewable tobacco products for at least 6 hours before surgery.  No nicotine patches on the day of surgery.  Do not use any recreational drugs for at least a week (preferably 2 weeks) before your surgery.  Please be advised that the combination of cocaine and anesthesia may have negative outcomes, up to and including death. If you test positive for cocaine, your surgery will be cancelled.  On the morning of surgery brush your teeth with toothpaste and water, you may rinse your mouth with mouthwash if you wish. Do not swallow any toothpaste or mouthwash.  Take a fresh shower the morning of your procedure.   Do not wear jewelry, make-up, hairpins, clips or nail polish.  For welded (permanent) jewelry: bracelets, anklets, waist bands, etc.  Please have this removed prior to surgery.  If it is not removed, there is a chance that hospital personnel will need to cut it off on the day of surgery.  Do not wear lotions, powders, or perfumes.   Do not shave body hair from the neck down 48 hours before surgery.  Contact lenses, hearing aids and dentures may not be worn into surgery.  Do not bring valuables to the hospital. [facility] is not responsible for any missing/lost belongings or valuables.   Notify your doctor if there is any change in your medical condition (cold, fever, infection).  Wear comfortable clothing (specific to your surgery type) to the hospital.  After  surgery, you can help prevent lung complications by doing breathing exercises.  Take deep breaths and cough every 1-2 hours. Your doctor may order a device called an Incentive Spirometer to help you take deep breaths. When coughing or sneezing, hold a pillow firmly against your incision with both hands. This is called splinting. Doing this helps protect your incision. It also decreases belly discomfort.  If you are being  admitted to the hospital overnight, leave your suitcase in the car. After surgery it may be brought to your room.  In case of increased patient census, it may be necessary for you, the patient, to continue your postoperative care in the Same Day Surgery department.  If you are being discharged the day of surgery, you will not be allowed to drive home. You will need a responsible individual to drive you home and stay with you for 24 hours after surgery.   If you are taking public transportation, you will need to have a responsible individual with you.  Please call the Pre-admissions Testing Dept. at [phone] if you have any questions about these instructions.  Surgery Visitation Policy:  Patients having surgery or a procedure may have two visitors.  Children under the age of 52 must have an adult with them who is not the patient.  Inpatient Visitation:    Visiting hours are 7 a.m. to 8 p.m. Up to four visitors are allowed at one time in a patient room. The visitors may rotate out with other people during the day.  One visitor age 98 or older may stay with the patient overnight and must be in the room by 8 p.m.   Merchandiser, Retail to address health-related social needs:  [email or web address]

## 2024-05-25 ENCOUNTER — Inpatient Hospital Stay: Admission: RE | Admit: 2024-05-25

## 2024-05-25 VITALS — BP 133/94 | HR 73 | Temp 98.1°F | Resp 16

## 2024-05-25 DIAGNOSIS — D509 Iron deficiency anemia, unspecified: Secondary | ICD-10-CM

## 2024-05-25 DIAGNOSIS — Z01818 Encounter for other preprocedural examination: Secondary | ICD-10-CM

## 2024-05-25 MED ORDER — IRON SUCROSE 300 MG IVPB - SIMPLE MED
300.0000 mg | Freq: Once | Status: AC
  Administered 2024-05-25: 300 mg via INTRAVENOUS
  Filled 2024-05-25: qty 300

## 2024-05-25 NOTE — Progress Notes (Signed)
 No reactions noted. Vitals are WNL. Pt stated she has had infusions before with no reactions and prefers not to stay 30 minutes post infusions to monitor. Patient stayed 10 minutes post infusions and stated she felt with in normal limits and requested to be discharged.

## 2024-05-30 ENCOUNTER — Other Ambulatory Visit

## 2024-05-31 ENCOUNTER — Encounter: Payer: Self-pay | Admitting: Urgent Care

## 2024-05-31 ENCOUNTER — Encounter: Admission: RE | Payer: Self-pay | Source: Home / Self Care

## 2024-05-31 ENCOUNTER — Ambulatory Visit: Admission: RE | Admit: 2024-05-31 | Payer: Self-pay | Source: Home / Self Care | Admitting: Obstetrics and Gynecology

## 2024-05-31 DIAGNOSIS — Z01818 Encounter for other preprocedural examination: Secondary | ICD-10-CM

## 2024-05-31 SURGERY — HYSTERECTOMY, VAGINAL, WITH SALPINGECTOMY
Anesthesia: General | Site: Uterus | Laterality: Bilateral
